# Patient Record
Sex: Female | Born: 1935 | Race: Black or African American | Hispanic: No | Marital: Married | Smoking: Never smoker
Health system: Southern US, Community
[De-identification: ages and names within clinical notes are randomized; demographics above are authoritative.]

## PROBLEM LIST (undated history)

## (undated) DIAGNOSIS — F0390 Unspecified dementia without behavioral disturbance: Secondary | ICD-10-CM

## (undated) DIAGNOSIS — N189 Chronic kidney disease, unspecified: Secondary | ICD-10-CM

## (undated) DIAGNOSIS — R269 Unspecified abnormalities of gait and mobility: Secondary | ICD-10-CM

## (undated) DIAGNOSIS — I10 Essential (primary) hypertension: Secondary | ICD-10-CM

## (undated) DIAGNOSIS — R443 Hallucinations, unspecified: Secondary | ICD-10-CM

## (undated) DIAGNOSIS — M4712 Other spondylosis with myelopathy, cervical region: Secondary | ICD-10-CM

## (undated) DIAGNOSIS — M47812 Spondylosis without myelopathy or radiculopathy, cervical region: Secondary | ICD-10-CM

## (undated) HISTORY — DX: Hallucinations, unspecified: R44.3

## (undated) HISTORY — PX: OTHER SURGICAL HISTORY: SHX169

## (undated) HISTORY — PX: KNEE ARTHROSCOPY: SUR90

## (undated) HISTORY — PX: LUMBAR LAMINECTOMY: SHX95

## (undated) HISTORY — DX: Unspecified dementia, unspecified severity, without behavioral disturbance, psychotic disturbance, mood disturbance, and anxiety: F03.90

## (undated) HISTORY — DX: Unspecified abnormalities of gait and mobility: R26.9

---

## 1998-02-27 ENCOUNTER — Ambulatory Visit (HOSPITAL_COMMUNITY): Admission: RE | Admit: 1998-02-27 | Discharge: 1998-02-27 | Payer: Self-pay | Admitting: Neurology

## 1998-05-12 ENCOUNTER — Other Ambulatory Visit: Admission: RE | Admit: 1998-05-12 | Discharge: 1998-05-12 | Payer: Self-pay | Admitting: Emergency Medicine

## 1999-05-23 ENCOUNTER — Emergency Department (HOSPITAL_COMMUNITY): Admission: EM | Admit: 1999-05-23 | Discharge: 1999-05-23 | Payer: Self-pay | Admitting: Emergency Medicine

## 1999-05-23 ENCOUNTER — Encounter: Payer: Self-pay | Admitting: Emergency Medicine

## 1999-09-03 ENCOUNTER — Ambulatory Visit (HOSPITAL_COMMUNITY): Admission: RE | Admit: 1999-09-03 | Discharge: 1999-09-03 | Payer: Self-pay | Admitting: Gastroenterology

## 1999-10-29 ENCOUNTER — Encounter: Payer: Self-pay | Admitting: Emergency Medicine

## 1999-10-29 ENCOUNTER — Encounter: Admission: RE | Admit: 1999-10-29 | Discharge: 1999-10-29 | Payer: Self-pay | Admitting: Emergency Medicine

## 2000-01-17 ENCOUNTER — Emergency Department (HOSPITAL_COMMUNITY): Admission: EM | Admit: 2000-01-17 | Discharge: 2000-01-17 | Payer: Self-pay | Admitting: Emergency Medicine

## 2000-01-17 ENCOUNTER — Encounter: Payer: Self-pay | Admitting: Emergency Medicine

## 2000-02-25 ENCOUNTER — Ambulatory Visit (HOSPITAL_COMMUNITY): Admission: RE | Admit: 2000-02-25 | Discharge: 2000-02-25 | Payer: Self-pay | Admitting: Emergency Medicine

## 2001-01-22 ENCOUNTER — Encounter: Payer: Self-pay | Admitting: Gastroenterology

## 2001-01-22 ENCOUNTER — Encounter: Admission: RE | Admit: 2001-01-22 | Discharge: 2001-01-22 | Payer: Self-pay | Admitting: Gastroenterology

## 2001-07-10 ENCOUNTER — Other Ambulatory Visit: Admission: RE | Admit: 2001-07-10 | Discharge: 2001-07-10 | Payer: Self-pay | Admitting: Obstetrics and Gynecology

## 2001-09-14 ENCOUNTER — Emergency Department (HOSPITAL_COMMUNITY): Admission: EM | Admit: 2001-09-14 | Discharge: 2001-09-14 | Payer: Self-pay | Admitting: Emergency Medicine

## 2001-09-14 ENCOUNTER — Encounter: Payer: Self-pay | Admitting: Emergency Medicine

## 2001-10-08 ENCOUNTER — Ambulatory Visit: Admission: RE | Admit: 2001-10-08 | Discharge: 2001-10-08 | Payer: Self-pay | Admitting: Specialist

## 2001-11-19 ENCOUNTER — Encounter: Admission: RE | Admit: 2001-11-19 | Discharge: 2001-11-19 | Payer: Self-pay | Admitting: Specialist

## 2001-11-19 ENCOUNTER — Encounter: Payer: Self-pay | Admitting: Specialist

## 2002-01-16 ENCOUNTER — Inpatient Hospital Stay (HOSPITAL_COMMUNITY): Admission: RE | Admit: 2002-01-16 | Discharge: 2002-01-19 | Payer: Self-pay | Admitting: Specialist

## 2002-01-16 ENCOUNTER — Encounter: Payer: Self-pay | Admitting: Specialist

## 2002-08-05 ENCOUNTER — Other Ambulatory Visit: Admission: RE | Admit: 2002-08-05 | Discharge: 2002-08-05 | Payer: Self-pay | Admitting: Obstetrics and Gynecology

## 2002-10-18 ENCOUNTER — Encounter: Payer: Self-pay | Admitting: Specialist

## 2002-10-22 ENCOUNTER — Encounter: Payer: Self-pay | Admitting: Specialist

## 2002-10-22 ENCOUNTER — Inpatient Hospital Stay (HOSPITAL_COMMUNITY): Admission: RE | Admit: 2002-10-22 | Discharge: 2002-10-26 | Payer: Self-pay | Admitting: Specialist

## 2002-10-31 ENCOUNTER — Emergency Department (HOSPITAL_COMMUNITY): Admission: EM | Admit: 2002-10-31 | Discharge: 2002-10-31 | Payer: Self-pay | Admitting: Emergency Medicine

## 2002-10-31 ENCOUNTER — Encounter: Payer: Self-pay | Admitting: Emergency Medicine

## 2003-08-12 ENCOUNTER — Encounter: Admission: RE | Admit: 2003-08-12 | Discharge: 2003-08-12 | Payer: Self-pay | Admitting: Specialist

## 2003-10-17 ENCOUNTER — Encounter: Admission: RE | Admit: 2003-10-17 | Discharge: 2003-10-17 | Payer: Self-pay | Admitting: Specialist

## 2003-10-22 ENCOUNTER — Other Ambulatory Visit: Admission: RE | Admit: 2003-10-22 | Discharge: 2003-10-22 | Payer: Self-pay | Admitting: Obstetrics and Gynecology

## 2003-12-12 ENCOUNTER — Encounter: Admission: RE | Admit: 2003-12-12 | Discharge: 2003-12-12 | Payer: Self-pay | Admitting: Orthopedic Surgery

## 2004-02-09 ENCOUNTER — Inpatient Hospital Stay (HOSPITAL_COMMUNITY): Admission: RE | Admit: 2004-02-09 | Discharge: 2004-02-13 | Payer: Self-pay | Admitting: Orthopedic Surgery

## 2004-07-01 ENCOUNTER — Encounter: Admission: RE | Admit: 2004-07-01 | Discharge: 2004-07-01 | Payer: Self-pay | Admitting: Sports Medicine

## 2004-07-12 ENCOUNTER — Encounter: Admission: RE | Admit: 2004-07-12 | Discharge: 2004-07-12 | Payer: Self-pay | Admitting: Sports Medicine

## 2004-07-26 ENCOUNTER — Encounter: Admission: RE | Admit: 2004-07-26 | Discharge: 2004-07-26 | Payer: Self-pay | Admitting: Sports Medicine

## 2004-11-18 ENCOUNTER — Other Ambulatory Visit: Admission: RE | Admit: 2004-11-18 | Discharge: 2004-11-18 | Payer: Self-pay | Admitting: Obstetrics and Gynecology

## 2005-08-13 ENCOUNTER — Encounter: Admission: RE | Admit: 2005-08-13 | Discharge: 2005-08-13 | Payer: Self-pay | Admitting: Emergency Medicine

## 2006-05-15 ENCOUNTER — Encounter: Admission: RE | Admit: 2006-05-15 | Discharge: 2006-05-15 | Payer: Self-pay | Admitting: Orthopedic Surgery

## 2006-06-01 ENCOUNTER — Encounter: Admission: RE | Admit: 2006-06-01 | Discharge: 2006-06-01 | Payer: Self-pay | Admitting: Orthopedic Surgery

## 2006-06-20 ENCOUNTER — Encounter: Admission: RE | Admit: 2006-06-20 | Discharge: 2006-06-20 | Payer: Self-pay | Admitting: Orthopedic Surgery

## 2007-04-26 ENCOUNTER — Encounter: Admission: RE | Admit: 2007-04-26 | Discharge: 2007-04-26 | Payer: Self-pay | Admitting: Sports Medicine

## 2007-05-04 ENCOUNTER — Encounter: Admission: RE | Admit: 2007-05-04 | Discharge: 2007-05-04 | Payer: Self-pay | Admitting: Sports Medicine

## 2007-05-15 ENCOUNTER — Encounter: Admission: RE | Admit: 2007-05-15 | Discharge: 2007-05-15 | Payer: Self-pay | Admitting: Sports Medicine

## 2007-09-11 ENCOUNTER — Encounter: Admission: RE | Admit: 2007-09-11 | Discharge: 2007-09-11 | Payer: Self-pay | Admitting: Chiropractic Medicine

## 2007-12-11 ENCOUNTER — Encounter: Admission: RE | Admit: 2007-12-11 | Discharge: 2007-12-11 | Payer: Self-pay | Admitting: Neurology

## 2007-12-27 ENCOUNTER — Encounter: Admission: RE | Admit: 2007-12-27 | Discharge: 2007-12-27 | Payer: Self-pay | Admitting: Sports Medicine

## 2008-04-15 ENCOUNTER — Encounter: Admission: RE | Admit: 2008-04-15 | Discharge: 2008-04-15 | Payer: Self-pay | Admitting: Sports Medicine

## 2008-07-10 ENCOUNTER — Encounter: Admission: RE | Admit: 2008-07-10 | Discharge: 2008-07-10 | Payer: Self-pay | Admitting: Sports Medicine

## 2009-03-10 ENCOUNTER — Encounter: Admission: RE | Admit: 2009-03-10 | Discharge: 2009-03-10 | Payer: Self-pay | Admitting: Sports Medicine

## 2009-07-06 ENCOUNTER — Encounter: Admission: RE | Admit: 2009-07-06 | Discharge: 2009-07-06 | Payer: Self-pay | Admitting: Sports Medicine

## 2009-08-24 ENCOUNTER — Encounter: Admission: RE | Admit: 2009-08-24 | Discharge: 2009-08-24 | Payer: Self-pay | Admitting: Sports Medicine

## 2009-11-05 ENCOUNTER — Encounter: Admission: RE | Admit: 2009-11-05 | Discharge: 2009-11-05 | Payer: Self-pay | Admitting: Sports Medicine

## 2010-02-17 ENCOUNTER — Encounter: Admission: RE | Admit: 2010-02-17 | Discharge: 2010-02-17 | Payer: Self-pay | Admitting: Sports Medicine

## 2010-05-10 ENCOUNTER — Inpatient Hospital Stay (HOSPITAL_COMMUNITY)
Admission: EM | Admit: 2010-05-10 | Discharge: 2010-05-13 | Payer: Self-pay | Source: Home / Self Care | Admitting: Emergency Medicine

## 2010-05-11 ENCOUNTER — Ambulatory Visit: Payer: Self-pay | Admitting: Internal Medicine

## 2010-06-23 ENCOUNTER — Encounter: Payer: Self-pay | Admitting: Internal Medicine

## 2010-06-30 ENCOUNTER — Encounter
Admission: RE | Admit: 2010-06-30 | Discharge: 2010-06-30 | Payer: Self-pay | Source: Home / Self Care | Attending: Sports Medicine | Admitting: Sports Medicine

## 2010-07-04 ENCOUNTER — Encounter: Payer: Self-pay | Admitting: Emergency Medicine

## 2010-07-04 ENCOUNTER — Encounter: Payer: Self-pay | Admitting: Specialist

## 2010-07-15 NOTE — Procedures (Signed)
Summary: ERCP  Patient: [name] Note: All result statuses are Final unless otherwise noted.  Tests: (1) ERCP (ERC)   ERC ERCP                  DONE     [facility]     [address]     [place], [place]  [address]           ERCP PROCEDURE REPORT           PATIENT:  [name], [name]  MR#:  [id number]     BIRTHDATE:  11-Jan-1935  GENDER:  female           ENDOSCOPIST:  Iva Boop, MD, [facility]           PROCEDURE DATE:  05/11/2010     PROCEDURE:  ERCP with sphincterotomy, ERCP with removal of stones           INDICATIONS:  stone CBD stone seen on [place]           MEDICATIONS:   Fentanyl 75 mcg IV, Versed 4 mg IV On Zosyn IV also     TOPICAL ANESTHETIC:  Cetacaine Spray           DESCRIPTION OF PROCEDURE:   After the risks benefits and     alternatives of the procedure were thoroughly explained, informed     consent was obtained.  The Pentax ERCP [id number] endoscope was     introduced through the mouth and advanced to the second portion of     the duodenum.           The endoscopic survey of the stomach and duodenum was normal with     limited views of the esophagus.  A 10 mm stone was found in the     common bile duct. Sphincterotomy was performed with a shortwire     papillotome using guidewire technique. A stone retrieval balloon     was passed and all apparent stones removed. Some air bubbles were     seen. A dilatation was found in the extrahepatic ducts. Up to 12     mm  Multiple stones were found in the gallbladder.  The     intrahepatic and extrahepatic bile ducts were otherwise normal.     Pancreatogram was not attempted.   The scope was then completely     withdrawn from the patient and the procedure terminated.     <<PROCEDUREIMAGES>>           COMPLICATIONS:  None           ENDOSCOPIC IMPRESSION:     1) Stone in the common bile duct - removed after sphincterotomy           2) Dilatation in the extrahepatic ducts up to 12 mm     3) Stones,  multiple in the gallbladder     4) Otherwise normal  biliary tree     5) Normal endoscopic survey of stomach and duodenum     RECOMMENDATIONS:     clear liquids now     [facility] consult (I called) re: gallstones     F/U labs tomorrow           Iva Boop, MD, [facility]           CC:  [facility]     Tally Joe, M.D.           n.  eSIGNED:   Iva Boop at 05/11/2010 03:38 PM           [name], [id number]  Note: An exclamation mark (!) indicates a result that was not dispersed into the flowsheet. Document Creation Date: 05/11/2010 4:09 PM _______________________________________________________________________  (1) Order result status: Final Collection or observation date-time: 05/11/2010 15:29 Requested date-time:  Receipt date-time:  Reported date-time:  Referring Physician:   Ordering Physician: Stan Head [phone]) Specimen Source:  Source: Launa Grill Order Number: [id number] Lab site:

## 2010-07-29 NOTE — Letter (Signed)
Summary: [facility]   Imported By: Lennie Odor 07/22/2010 15:50:00  _____________________________________________________________________  External Attachment:    Type:   Image     Comment:   External Document

## 2010-08-23 LAB — BASIC METABOLIC PANEL
BUN: 10 mg/dL (ref 6–23)
CO2: 28 mEq/L (ref 19–32)
Chloride: 105 mEq/L (ref 96–112)
Creatinine, Ser: 1.09 mg/dL (ref 0.4–1.2)
Glucose, Bld: 85 mg/dL (ref 70–99)

## 2010-08-23 LAB — PROTIME-INR: INR: 1.21 (ref 0.00–1.49)

## 2010-08-23 LAB — HEMOGLOBIN A1C
Hgb A1c MFr Bld: 5.5 % (ref <5.7)
Mean Plasma Glucose: 111 mg/dL (ref <117)

## 2010-08-23 LAB — HEPATIC FUNCTION PANEL
ALT: 157 U/L — ABNORMAL HIGH (ref 0–35)
Indirect Bilirubin: 0.7 mg/dL (ref 0.3–0.9)
Total Bilirubin: 1.7 mg/dL — ABNORMAL HIGH (ref 0.3–1.2)

## 2010-08-23 LAB — CBC
Hemoglobin: 12.6 g/dL (ref 12.0–15.0)
MCV: 92.6 fL (ref 78.0–100.0)
RBC: 4.17 MIL/uL (ref 3.87–5.11)
WBC: 3.7 10*3/uL — ABNORMAL LOW (ref 4.0–10.5)

## 2010-08-23 LAB — MAGNESIUM: Magnesium: 1.8 mg/dL (ref 1.5–2.5)

## 2010-08-23 LAB — LIPASE, BLOOD: Lipase: 32 U/L (ref 11–59)

## 2010-08-24 LAB — COMPREHENSIVE METABOLIC PANEL
ALT: 161 U/L — ABNORMAL HIGH (ref 0–35)
ALT: 64 U/L — ABNORMAL HIGH (ref 0–35)
AST: 215 U/L — ABNORMAL HIGH (ref 0–37)
Albumin: 3 g/dL — ABNORMAL LOW (ref 3.5–5.2)
Albumin: 3.6 g/dL (ref 3.5–5.2)
Alkaline Phosphatase: 70 U/L (ref 39–117)
CO2: 29 mEq/L (ref 19–32)
Calcium: 9 mg/dL (ref 8.4–10.5)
GFR calc Af Amer: 59 mL/min — ABNORMAL LOW (ref >60)
GFR calc non Af Amer: 49 mL/min — ABNORMAL LOW (ref >60)
Glucose, Bld: 106 mg/dL — ABNORMAL HIGH (ref 70–99)
Potassium: 3.1 mEq/L — ABNORMAL LOW (ref 3.5–5.1)
Sodium: 140 mEq/L (ref 135–145)
Sodium: 142 mEq/L (ref 135–145)
Total Protein: 7.4 g/dL (ref 6.0–8.3)

## 2010-08-24 LAB — DIFFERENTIAL
Basophils Relative: 0 % (ref 0–1)
Eosinophils Absolute: 0 10*3/uL (ref 0.0–0.7)
Monocytes Absolute: 0.5 10*3/uL (ref 0.1–1.0)
Monocytes Relative: 8 % (ref 3–12)

## 2010-08-24 LAB — CBC
HCT: 40.1 % (ref 36.0–46.0)
Hemoglobin: 12.9 g/dL (ref 12.0–15.0)
Hemoglobin: 13.3 g/dL (ref 12.0–15.0)
MCHC: 33.2 g/dL (ref 30.0–36.0)
MCHC: 33.5 g/dL (ref 30.0–36.0)
MCV: 92.6 fL (ref 78.0–100.0)
Platelets: 141 10*3/uL — ABNORMAL LOW (ref 150–400)
RBC: 4.17 MIL/uL (ref 3.87–5.11)

## 2010-08-24 LAB — LIPID PANEL
Cholesterol: 172 mg/dL (ref 0–200)
HDL: 54 mg/dL (ref >39)
Triglycerides: 32 mg/dL (ref <150)

## 2010-08-24 LAB — URINE CULTURE
Colony Count: NO GROWTH
Culture  Setup Time: 201111300608
Culture: NO GROWTH

## 2010-08-24 LAB — CK TOTAL AND CKMB (NOT AT ARMC): CK, MB: 1 ng/mL (ref 0.3–4.0)

## 2010-08-24 LAB — CARDIAC PANEL(CRET KIN+CKTOT+MB+TROPI)
Relative Index: 0.5 (ref 0.0–2.5)
Troponin I: 0.02 ng/mL (ref 0.00–0.06)

## 2010-08-24 LAB — CULTURE, BLOOD (ROUTINE X 2): Culture  Setup Time: 201111290833

## 2010-08-24 LAB — LIPASE, BLOOD: Lipase: 32 U/L (ref 11–59)

## 2010-08-24 LAB — AMYLASE: Amylase: 349 U/L — ABNORMAL HIGH (ref 0–105)

## 2010-08-30 ENCOUNTER — Other Ambulatory Visit: Payer: Self-pay | Admitting: Sports Medicine

## 2010-08-30 DIAGNOSIS — M545 Low back pain: Secondary | ICD-10-CM

## 2010-09-01 ENCOUNTER — Ambulatory Visit
Admission: RE | Admit: 2010-09-01 | Discharge: 2010-09-01 | Disposition: A | Discharge status: Home / Self Care | Payer: MEDICARE | Source: Ambulatory Visit | Attending: Sports Medicine | Admitting: Sports Medicine

## 2010-09-01 ENCOUNTER — Other Ambulatory Visit: Payer: Self-pay | Admitting: Sports Medicine

## 2010-09-01 DIAGNOSIS — M545 Low back pain: Secondary | ICD-10-CM

## 2010-10-29 NOTE — Discharge Summary (Signed)
NAME:  SIMON, AABERG NO.:  [id number]   MEDICAL RECORD NO.:  [id number]                   PATIENT TYPE:  INP   LOCATION:                                       FACILITY:  [facility]   PHYSICIAN:  Wende Neighbors, P.A.              DATE OF BIRTH:  04/05/1935   DATE OF ADMISSION:  10/22/2002  DATE OF DISCHARGE:  10/26/2002                                 DISCHARGE SUMMARY   ADMISSION DIAGNOSES:  1. End-stage osteoarthritis of right knee.  2. History of hypertension with no current treatment with medication.  3. History of kidney stones.  4. History of anxiety.  5. Status post lumbar decompression, L3-S1 and status post cervical     decompression.   DISCHARGE DIAGNOSES:  1. End-stage osteoarthritis of right knee.  2. History of hypertension with no current treatment with medication.  3. History of kidney stones.  4. History of anxiety.  5. Status post lumbar decompression, L3-S1 and status post cervical     decompression.  6. Posthemorrhagic anemia requiring blood transfusion.  7. Hypokalemia.   PROCEDURE:  On Oct 22, 2002, the patient underwent right cemented total knee  arthroplasty performed by Kerrin Champagne, M.D., assisted by Alverda Skeans, PA-  C. under general anesthesia.   CONSULTATIONS:  Ellwood Dense, M.D. of Physical Medicine and  Rehabilitation.   BRIEF HISTORY:  The patient is a 76 year old black female who has been  treated for several years with right knee pain secondary to osteoarthritis.  At this time, she has failed conservative treatment and is requiring  narcotic analgesics for her discomfort.  She is having difficulty with  activities of daily living, secondary to persistent right knee pain and a  flexion deformity and valgus deformity.  Radiographs demonstrate end-stage  patellofemoral lateral joint line changes of osteoarthritis which are quite  severe.  It was felt that she would require surgical intervention and was  admitted for the procedure as stated above.   BRIEF HOSPITAL COURSE:  The patient tolerated the procedure under general  anesthesia without complications.  Postoperatively, neurovascular motor  function of the lower extremities was noted to be intact.  The patient was  placed in the CPM machine on the first postoperative day and tolerated this  well.  She started a physical therapy routine for ambulation and gait  training and was allowed 50% partial weightbearing on the operative  extremity.  She was able to maintain this weightbearing status and  progressed with physical therapy for ambulation.  Prior to discharge, she  had ambulated as much as 110 feet with close supervision during the hospital  stay.  Initially, it was felt she would be slow to progress and she did not  have assistance at home.  Therefore, rehabilitation consult was obtained.  She was felt to be a suitable candidate if she did not progress with her  activity.  However, she was able to do quite well and arrangements were made  for her to be discharged home.  During the hospital stay, she was treated  for postop anemia and required 2 units of blood on her second postoperative  day.  She tolerated the transfusion well and her hemoglobin returned to a  value of 8.9.  The patient was placed on Coumadin for DVT and PE  prophylaxis.  Adjustments in her Coumadin  dose were made according to daily  pro times by the pharmacist at [facility].  The patient had  hypokalemia which was treated with oral supplements and was noted to be  stable.  The patient's Hemovac drain was discontinued on the first  postoperative day and daily dressing changes were done thereafter.  The  patient had no signs of infection including no drainage, erythema.  She did  have some mild edema which was treated with ice packs.  The patient received  occupational therapy for ADL's and tolerated this well.  On Oct 26, 2002,  she was stable for  discharge to her home.   PERTINENT LABORATORY VALUES:  Chest x-ray on admission showed stable  borderline cardiomegaly with no active disease.  CBC on admission with  hemoglobin 11.3, hematocrit 34.5.  Lowest value was noted to be 8.1 and 24.4  for hemoglobin and hematocrit and after two units of autologous blood, the  values were 8.9 and 27.0.  Coagulation studies were monitored throughout the  hospital stay and at discharge, noted to be:  PT 23.6 and INR 2.5.  BMET on  admission was within normal limits.  She did have hypokalemia at 3.2 prior  to discharge.  Urinalysis on admission was negative for urinary tract  infection.   PLAN:  The patient was discharged to her home.  Arrangements were made for  her to be seen by [facility] for physical therapy.  She wanted  a total knee replacement protocol and will be partial weightbearing of 50%.  She will receive range of motion and strengthening exercises.  Dressing  change will be done daily at home and she will be allowed to shower on  postop day #5 if there is no wound drainage.  She will followup in two weeks  with Kerrin Champagne, M.D. and has been advised to call to make the  appointment.  She has been advised to call the office if there are questions  or concerns prior to her return office visit.   DISCHARGE MEDICATIONS:  The patient received prescriptions for OxyContin,  Percocet, Robaxin and Coumadin.  Coumadin management will be provided by  [facility].   DIET:  She will resume her regular diet.                                                   Wende Neighbors, P.A.    SMV/MEDQ  D:  01/30/2003  T:  01/31/2003  Job:  [id number]

## 2010-10-29 NOTE — Consult Note (Signed)
NAME:  NISHTHA, RAIDER NO.:  [id number]   MEDICAL RECORD NO.:  [id number]                   PATIENT TYPE:  INP   LOCATION:  5004                                 FACILITY:  [facility]   PHYSICIAN:  Lesleigh Noe, M.D.            DATE OF BIRTH:  1935-04-11   DATE OF CONSULTATION:  02/09/2004  DATE OF DISCHARGE:                                   CONSULTATION   REASON FOR CONSULTATION:  Tachycardia.   CONCLUSIONS:  1. Junctional tachycardia at 120 beats per minute with retrograde P waves,     acute onset.  The patient is hemodynamically stable.  2. Hypertension.  3. Total knee replacement, right, February 09, 2004.   RECOMMENDATIONS:  1. Telemetry.  2. IV Lopressor 5 mg to be followed in 15 minutes by an additional 5 mg if     tachycardia persists.  3. Serial cardiac enzymes.  4. EKG in a.m.   COMMENTS:  Ms. Siciliano is a 76 year old who has a history of  tachypalpitations.  This was documented to be supraventricular tachycardia  in 2004.  Echocardiogram at that time demonstrated normal left ventricular  size and function with normal valvular function, EF was felt to be normal.  Left atrial size was also normal at 3.9.  The patient underwent a right  total knee revision today and in the postop recovery developed tachycardia  at a rate of 115 to 120 that was regular and on monitor as well as EKG and  retrograde P waves were noted.  She is having no specific cardiac complaints  although for the past several weeks has had occasional discomfort in the  left arm.  She is not currently short of breath.   Her current medical regimen is Toprol-XL 50 mg per day which in the past has  suppressed this arrhythmia.   PHYSICAL EXAMINATION:  GENERAL:  The patient was lying flat.  She is in no  acute distress.  VITAL SIGNS:  The blood pressure is 124/70, heart rate is 117 and regular.  NECK:  Reveals no JVD, carotid massage did not bring about resolution of the  arrhythmia.  LUNGS:  Clear.  CARDIAC:  A regular tachycardia.  ABDOMEN:  Soft.  EXTREMITIES:  No edema.   EKG reveals junctional tachycardia at 117 beats per minute.  Retrograde P  waves are noted but no acute ischemic changes are noted.  Laboratory data is  pending.   DISCUSSION:  I believe this is the recurrence of the tachycardia the patient  has had off and on over the years.  We will give her some IV beta-blocker on  top of her standard therapy to see if this will break the tachycardia.  If  it will not, she will need to be transferred to a telemetry bed and  additional medical treatment options considered.  Lesleigh Noe, M.D.    HWS/MEDQ  D:  02/09/2004  T:  02/09/2004  Job:  [id number]   cc:   Reuben Likes, M.D.  [address]  [place]  [place] [address]  Fax: [phone]   Loreta Ave, M.D.  [address]  [place] [address]  Fax: [phone]

## 2010-10-29 NOTE — H&P (Signed)
[place]  Patient:    [name], [name] Visit Number: [id number] MRN: [id number]          Service Type: SUR Location: 1E 0105 01 Attending Physician:  Lubertha South Dictated by:   Druscilla Brownie Shela Nevin, P.A. Admit Date:  10/08/2001   CC:         Reuben Likes, M.D.  Peter M. Swaziland, M.D.   History and Physical  DATE OF BIRTH:  09/11/1934. DATE OF HISTORY AND PHYSICAL:  January 03, 2002.  CHIEF COMPLAINT:  "Pain in my back and legs."  HISTORY OF PRESENT ILLNESS:  This 76 year old female has had continuing problems concerning her low back with radiation in the lower extremities.  She has had progressive problems with degenerative disk disease and spinal stenosis, which have been well-documented by studies.  She has weakness developing in the dorsiflexion of the left foot.  We have tried her on analgesics as well as anti-inflammatory and, unfortunately, this has not helped.  Due to the stenosis at L2-3 with moderately severe L3-4 and L4-5 and foraminal narrowing seen at L5-S1, it was felt this patient would benefit from surgical intervention and is being admitted for central laminectomy at L3-4, L4-5, and L5-S1.  She has or will have donated two units of autologous blood for this procedure.  She has been seen by her physicians and, Reuben Likes, M.D., internal medicine, and Peter M. Swaziland, M.D., her cardiologist, are aware of the surgery and have cleared her for the surgery.  PAST MEDICAL HISTORY:  The patients surgeries include lithotripsy as well as scope removal of renal calculi.  She has had an anterior cervical diskectomy by Hewitt Shorts, M.D., in [phone].  Hysterectomy in 1978.  MEDICATIONS:  Celebrex, Premarin, and a Bayer aspirin a day.  ALLERGIES:  No known allergies.  SOCIAL HISTORY:  The patient neither smokes nor drinks.  FAMILY HISTORY:  Positive for colon cancer in the father.  REVIEW OF SYSTEMS:   CENTRAL NERVOUS SYSTEM:  No seizure disorder, paralysis, numbness, or double vision.  The patient does have radiculitis consistent with spinal stenosis.  CARDIOVASCULAR:  No chest pain, no angina, no orthopnea. GASTROINTESTINAL:  No nausea, vomiting, melena, or bloody stools.  She does have hemorrhoids, which do not bother her.  RESPIRATORY:  No productive cough, no hemoptysis, no shortness of breath.  GENITOURINARY:  No discharge, dysuria, or hematuria.  MUSCULOSKELETAL:  Primarily in present illness.  PHYSICAL EXAMINATION:  VITAL SIGNS:  Blood pressure 160/88, pulse 84 and regular, respirations are 12.  GENERAL:  An alert, cooperative, friendly 76 year old female.  HEENT:  Normocephalic.  PERRLA.  EOMs intact.  Oropharynx is clear.  CHEST:  Clear to auscultation.  No rhonchi, no rales.  CARDIAC:  Regular rate and rhythm.  No murmurs are heard.  ABDOMEN:  Soft, nontender.  Liver and spleen not felt.  GENITOURINARY, RECTAL, PELVIC, BREASTS:  Not done, not pertinent to present illness.  EXTREMITIES:  As in present illness above.  ADMITTING DIAGNOSES: 1. Lumbar spinal stenosis L3-4, L4-5, L5-S1. 2. History of anxiety attacks (remote).  PLAN:  The patient will undergo central lumbar laminectomy at L3-4, L4-5, L5-S1.  She has donated two units of autologous blood.  Should she have any medical problems in the hospital, we will certainly call Dr. Leslee Home, et al., and should she have any cardiology problems, we will call Dr. [place]iland. Dictated by:   Druscilla Brownie. Shela Nevin, P.A. Attending Physician:  Lubertha South DD:  01/03/02 TD:  01/07/02 Job: [id number]

## 2010-10-29 NOTE — Op Note (Signed)
NAME:  ADRIEANNA, [name]                            ACCOUNT NO.:  [id number]   MEDICAL RECORD NO.:  [id number]                   PATIENT TYPE:  INP   LOCATION:  5012                                 FACILITY:  [facility]   PHYSICIAN:  Kerrin Champagne, M.D.                DATE OF BIRTH:  10-22-1934   DATE OF PROCEDURE:  10/22/2002  DATE OF DISCHARGE:                                 OPERATIVE REPORT   PREOPERATIVE DIAGNOSIS:  Right knee severe osteoarthritis with valgus and  flexion deformity.   POSTOPERATIVE DIAGNOSIS:  Right knee severe osteoarthritis with valgus and  flexion deformity.   PROCEDURE:  Right cemented total knee arthroplasty using [employer] Scorpio  flexed posterior stabilized knee implants, #7 cemented femoral component,  and #7 cemented tibial component with a 10 mm Flex posterior stabilized  tibial tray.  26 mm of patella component.   SURGEON:  Kerrin Champagne, M.D.   ASSISTANT:  Wende Neighbors, P.A.   ANESTHESIA:  GOT by Belva Agee, M.D. supplemented with local femoral  nerve block.   ESTIMATED BLOOD LOSS:  150 mL.  Drains; Foley to straight drain and Hemovac  x1.   TOURNIQUET TIME:  1 hour 57 minutes at 305 mmHg.   INDICATIONS FOR PROCEDURE:  The patient is a 76 year old female who has been  treated for years for problems of right knee osteoarthritis.  This has  gotten progressively worse to the point where she is requiring narcotic  medications to sleep at night.  She has undergone conservative attempts at  treatment including anti-inflammatory agents, steroid injections, Synvisc  injections and all without relief of pain.  Only temporizing her discomfort.  She has had recent back surgery for problems of spinal stenosis, did well  following this surgery.  Persistent right knee pain with flexion deformity  and valgus deformity.  Plain radiographs demonstrating both patellofemoral  and lateral joint line changes of osteoarthritis that was severe.  She is  brought to the operating room to undergo right total knee arthroplasty.   DESCRIPTION OF PROCEDURE:  After adequate general anesthesia, the right  lower extremity was prepped from the toes to the upper thigh.  A tourniquet  about the upper thigh.  Draped in the usual manner with a bump under the  right buttock.  An iodine Idrape was used.  Standard preoperative  antibiotics.  The incision approximately 15 cm in length standard in the  midline of the quadriceps tendon over the anteromedial aspect of the patella  and in line with the medial aspect of the anterior tibial tubercle.  Additionally through the skin and subcu layers using a 10 blade scalpel  directly down to the external retinaculum of the knee and this was then  incised in line, developed both medial and lateral.  The deep retinacular  layers of the knee incised along the medial parapatellar region along the  medial border of the patella tendon and extended into the midportion of the  quadriceps proximally. The patella then everted and a fat pad debrided over  the posterior aspect of the patella tendon over about 50%. The medial knee  retinaculum along with the periosteum was incised over the medial aspect of  the anterior tibial tubercle and then elevated exposing the anteromedial  aspect of the proximal tibia.  The medial meniscus then excised in total  using electrocautery.  Patella everted and the lateral patella ligament  incised and divided.  The knee was then flexed.  The lateral meniscus  debrided using a 10 blade scalpel with electrocautery.  A drill hole was  then placed into the distal femur, the guide wires in place for the distal  femoral cutting jig.  This was done after debridement of the ACL and PCL in  the midline. The distal cutting jig was then placed and pinned into place  and approximately 10 degrees of external rotation lined up the epicondylar  axis.  This was pinned to the distal femur.  Additional 2 mm of  cut chose to  be taken from the distal femur because of flexion deformity present.  The  cut was then performed transversely without difficulty protecting the soft  tissue sleeve.  The distal femoral sizing guide then placed and the fat pad  over the upper aspect of the supracondylar region was elevated off of the  bone anteriorly.  The gauge was then used to ascertain the correct size of  the femoral component, chose as a cement #7.  This then chosen, pins were  then placed into the distal femur for alignment of the cutting guide for the  distal femoral cutting for chamfers and coronal cuts.  After placing these  pins and removing the distal sizing guide, the cutting block for the chamfer  cuts and coronal cuts both anteriorly and posteriorly was then placed over  the end of the femur and then held in place with clamps.  First the anterior  cut was performed protecting the soft tissue and removing it appropriately,  then protecting the posterior aspect of the posterior femoral condyles and  the capsular areas with __________ .  The coronal cuts were then made over  the posterior aspect of the femoral condyles.  Chamfer cuts were then  performed posteriorly and then anterior and all bone excess then removed.  Cutting block then removed.   The remaining portions of the menisci posteriorly were then excised  bilaterally along with partial capsulotomy performed here.  The remaining  remnants of the posterior cruciate ligament excised as well, freeing up  posterior elements.  The popliteus tendon was able to be preserved.  The  knee then subluxed, tripped the tibia anteriorly.  McGill retractors  inserted.  The tibial spines removed using oscillating saw and a #7 tray  then used to place the drill guide and place a drill hole into the proximal  tibial channel.  The canal finder was then placed and then the alignment guide placed along with a cutting jig for the proximal tibia set at a 5   degree posterior slope.  This was then pinned in the corrected degree of  internal rotation using alignment guide, bisecting the distal ankle and  malleolar axis.  This was pinned in appropriate position.  4mm cut off of  the lateral aspect of the joint line providing the most superficial of cuts.  The soft tissue was then protected and  the oscillating saw then used to  perform the cut after pinning the tibial guide and removing the  intermedullary guide portion of the cutting jig.  Following this cut, it was  judged that the cut was too short so an additional 2 mm were taken off after  placement of trial components and after performing cutting of the distal  femoral notch.  Both the intercondylar notch as well as the anterior  intercondylar groove cut using the appropriate cutting jig.  Trial reduction  was performed using the femoral components.  Trial femoral component #7 and  the tibial tray with 10 mm insert.  The knee could not be fully extended, so  an additional 2 mm was cut off the proximal tibia using the previously  placed pins that were left in during this time. A total of 8 mm then was cut  off the proximal tibia.  With this then, trial was carried with full  extension of the patient's right knee and flexion to over 125 degrees  without lift off of the tibial try. No tendency for subluxation of the  tibial tray noted.  Irrigation was then performed.  The 26 mm reamer used to  ream the patella over the medial aspect of the retro portion of the patella  to a depth of 10 mm.  All osteophytes were resected and drill holes were  used to additionally place within the patella posterior aspect of the  patella for fixation purposes.  Once this was completed, then irrigation was  performed.  The knee then hyperflexed, McGill retractors then reinserted  after marking the correct alignment for the tibial tray component again  using the tibial alignment guide pin and placing marks over the  anterior  proximal tibia both medial and lateral.  After placing the McGill's and  removing the femoral trial component, then a #7 tray was then chosen.  This  was then fixed in the correct degree of internal rotation based on the marks  placed over the anterior tibial proximal.  Two pins were used to fix it and  then osteotome was then used to cut the trough of the flange for the tibial  component.  This was done up to a #7 cement flange.  Once this was completed  then all cuts had been performed.  Irrigation was performed.  Careful  cauterization of soft tissue in the expected areas of arteries of the  posterior medial and posterior lateral aspect of the joint and also in the  region of expected perforators would be over the posterior knee region.  After further irrigation and bone removed from the intercondylar region was  then placed into the hole for the alignment guide and impacted into place. Cement was then mixed and permanent prostheses brought into the field.  Tibial component was first placed after pulsatile lavage and irrigation of  the knee was performed.  The knee flexed.  The McGill was used to retract  the soft tissue for exposure.  The soft tissue retraction. The tibial  component was then placed.  More cement was added to the medial aspect of  the tibial tray cement region.  Excess cement was removed.  The femoral  component then cemented into place with cement over the posterior trays over  the anterior aspects of the distal femur and over the distal cut surface of  the femur.  Impacted into place.  Additional bone cement placed into the  distal femoral hole where the alignment guide had  been placed previously.  This still remained slightly opened.  With this, the patella component was  then placed as well using additional cement here.  Each of the areas for the  holes had been marked previously and scored using electrocautery.  The  implant then placed and the  compression clamp used to compress the patella  to bone in place and excess cement removed.  Excess cement was removed from  all of the periprosthetic areas over the femur anteriorly and tibia  anteriorly with the knee then brought into full extension and left soft  until the cement had completely hardened.  When this was completed, again  irrigation was performed.  The tourniquet was released and the knee  inspected for any obvious bleeding.  Small bleeders within the subcu areas  were noted.  Over the deep areas there was no significant bleeding regions  where expected __________ were present.  With flexion and extension of the  knee, it was noted that the patella tended to tilt laterally and show some  catching within the notch in the distal portion of the femur so that a  lateral retinacular release was performed and osteophytes over the medial  and lateral aspects of the patella were excised as well as over the superior  pole of the patella.  This seemed to correct the problem quite nicely.  So  that with flexion and extension, the patella did not show any further  catching within the distal femoral notch.  Irrigation again performed.  Medium Hemovac drain placed in the incision exiting over the anterior  lateral aspect of the thigh.  The synovial lining then closed with a running  stitch of 0 Vicryl suture.  The retinaculum then closed with interrupted #1  Vicryl suture and the external retinaculum closed with interrupted 0 Vicryl.  Deep subcu layers approximated with interrupted 0 and 2-0 Vicryl sutures.  The skin closed with a running 4-0 Monocryl suture.  Tincture Benzoin and  Steri-Strips applied.  4x4's and ABD pads affixed to the skin with Kerlix  and Ace wrap applied.  The patient then reactivated and extubated.                                               Kerrin Champagne, M.D.    JEN/MEDQ  D:  10/22/2002  T:  10/23/2002  Job:  [id number]

## 2010-10-29 NOTE — Discharge Summary (Signed)
NAME:  [name], SICARD NO.:  [id number]   MEDICAL RECORD NO.:  [id number]                   PATIENT TYPE:  INP   LOCATION:  4741                                 FACILITY:  [facility]   PHYSICIAN:  Duke Salvia, M.D.               DATE OF BIRTH:  December 21, 1934   DATE OF ADMISSION:  02/09/2004  DATE OF DISCHARGE:                                 DISCHARGE SUMMARY   PHYSICIANS:  Cardiologist: Lyn Records, M.D.  Seen by Duke Salvia, M.D.  Primary care giver:  Reuben Likes, M.D.   ALLERGIES:  no known drug allergies.   HISTORY OF PRESENT ILLNESS:  Ms. [name] is a 76 year old female.  She is  currently at [facility] for revision of a right total knee  arthroplasty.  This was done on August 29.  She was found to have  supraventricular tachyarrhythmias on telemetry here, but the patient has  complained of rapid racing heart rate for the last two years.  At first,  this occurred only once every six months and last about 30 minutes.  She  would get left arm numbness, feeling of onset of asthma that she had as a  child, and a feeling in the chest as if it were to burst loose.  In the last  six months, these episodes have become more frequent, and they last longer.  Sometimes they last overnight.  Now they come on with exercise. They come on  with stress.  When she is in the rapid rate, she feels short of breath, weak  like she was going to pass out (but she has never had syncope).  Does not  necessarily feel her heart racing, but she feels stupid.  She can have  this rhythm occur when she bends over.  She has had prescription for beta  blocker in the past with instructions not to drink any caffeinated  beverages.  She does like Mellow Yellow and [facility], but she has been  staying strictly away from these.  Currently is feeling upset with any  exercise or stress that she is liable to slip into this rapid rate.   Thromboembolic risk factors for  coronary artery disease, hypertension.  The  patient gives no prior history of myocardial infarction, diabetes, or GI  bleed.  No prior history of pulmonary embolism or deep vein thrombosis.  No  history of CVA or seizures.  She has a history of stress test in the past  but is unsure of the result, and they are not on the chart.  She had  echocardiogram July 2004 showing mild tricuspid regurgitation and normal  left ventricular function.   MEDICATIONS HERE AT :  1.  Colace 100 mg twice daily.  2.  Trinsicon 1 capsule twice daily.  3.  Coumadin per pharmacy with concurrent IV heparin until Coumadin  therapeutic.  4.  Toprol XL 50 mg daily in the morning and Toprol XL 25 mg at bedtime.  5.  Premarin 0.3 mg daily.  6.  Robaxin 500 mg q.6h.  7.  She has had one supplementation of potassium.   SOCIAL HISTORY:  The patient lives in [place] with her husband.  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  She has never smoked, no  alcoholic beverages, no recreational drugs.   FAMILY HISTORY:  Not very contributory.  Her mother, she says, had  hypertension, but she is not sure of the cause of death.  Her father died at  age 66 of colon cancer. She had one brother who deceased; she is not sure of  his medical condition.  Her brother, who is living, has no coronary artery  disease.  She has seven sisters, all of whom are living but none of whom  have diabetes or coronary artery disease.   REVIEW OF SYSTEMS:  The patient is constitutionally not feeling any fevers,  chills, sweats.  No weight change, either loss or gain, and no adenopathy.  HEENT:  No epistaxis, no hoarseness, no vertigo, no photophobia.  INTEGUMENT:  No rashes or lesions or ulceration.  CARDIOPULMONARY:  The  patient is not complaining of chest pain, dyspnea on exertion, orthopnea,  paroxysmal nocturnal dyspnea.  She does have some  shortness of breath when  she is in the throes of this rapid heart rate.  She feels presyncopal.  MUSCULOSKELETAL:  She has arthralgias, particularly affecting both knees.  She is currently status post revision of a right total knee.  UROGENITAL:  She does get up once a night to urinate.  NEUROPSYCHIATRIC:  The patient  complains of weakness with these arrhythmias.  GI:  The patient has not ever  had any melena, bright red blood per rectum, abdominal pain, or GERD  symptoms.  No chronic nausea, vomiting.  No dysphagia or odynophagia.  ENDOCRINE:  The patient is not sure of her thyroid status, but she is not  having polyuria or heat or cold intolerance.  Other systems are negative.   PHYSICAL EXAMINATION:  VITAL SIGNS:  Temperature 97.5, pulse 54 and regular.  Respirations 18, blood pressure 93/57.  Oxygen saturation 94% on room air.  GENERAL:  At time of examination, the patient is alert and oriented x 3 and  comfortable, in no acute distress.  Telemetry shows sinus rhythm.  HEENT:  Eyes:  Pupils equal, round, and reactive to light.  Extraocular  movements intact.  Sclerae are clear.  Nares without discharge.  The  oropharyngeal mucous membranes are pink and moist without lesion or  erythema.  NECK:  Supple.  No carotid bruits auscultated.  No jugular venous  distention.  CARDIOVASCULAR:  Heart has regular rate and rhythm.  S1 and S2 clearly  auscultated.  LUNGS:  Clear to auscultation and percussion bilaterally.  ABDOMEN:  Nondistended, soft.  Bowel sounds in all quadrants.  No rebound or  guarding.  No hepatosplenomegaly.  No midline pulsations.  EXTREMITIES:  Radial pulses are 4/4 bilaterally.  Dorsalis pedis pulses 3/4  bilaterally.  No rashes or ulcerations.  MUSCULOSKELETAL:  Right knee currently is bandaged status post revision of  right total knee arthroplasty.  NEUROLOGIC:  No focal neurologic deficits noted.  LABORATORY AND X-RAY DATA:  Complete blood count:  White cells  6.6,  hemoglobin 10.7, hematocrit 31.5,  platelets 146.  Serum electrolytes:  Sodium 136, potassium 4.2, chloride 112, carbonate 17, BUN 9, creatinine  0.9, glucose 121.  Troponin I studies x 2 were 0.02 then 0.04.  BNP was 208.  PT as of today is 14.5, INR 1.2.   PROBLEM LIST:  1.  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  2.  Presyncope, shortness of breath, chest pain, and left arm numbness      secondary to tachyarrhythmia.  3.  Negative Cardiolite for this patient, unknown date.  4.  Status post revision of right total knee arthroplasty.  5.  Hypertension.  6.  Nightmares on TOPROL.   RECOMMENDATIONS:  1.  Dr. Graciela Husbands has seen the patient and examined the patient and researched      the patient's history, recommending a possible different beta blocker      than TOPROL which continues to give nightmares.  2.  Agree with adjunctive calcium channel blocker.  3.  Radio frequency catheter ablation if symptoms persist and patient is      desirous of ablation rather than continued medication after a few weeks      of anticoagulation when the risk of DVT has abated following right knee      surgery.  4.  The patient will be set up for followup appointment with Dr. Sherryl Manges in the office in three to four weeks.   Electrocardiogram:  One from August 29 showing a tachycardia with  ventricular rate of 117, PR interval 308, QRS duration 100, QTC 452.  The  study shows retrograde P waves most clearly in lead in II and P waves  following the QRS in aVL, also in lead V1 and possible retrograde P waves in  aVF which follow the QRS.  This is a short RP supraventricular tachycardia.      Maple Mirza, P.A.                    Duke Salvia, M.D.    GM/MEDQ  D:  02/11/2004  T:  02/12/2004  Job:  [id number]   cc:   Duke Salvia, M.D.   Reuben Likes, M.D.  [address]  [place]  [place] [address]  Fax: [phone]   Lyn Records III, M.D.  [address]  [place]  [place] [address]  Fax: [phone]

## 2010-10-29 NOTE — Op Note (Signed)
NAME:  [name], [name] NO.:  [id number]   MEDICAL RECORD NO.:  [id number]                   PATIENT TYPE:  INP   LOCATION:  2550                                 FACILITY:  [facility]   PHYSICIAN:  Loreta Ave, M.D.              DATE OF BIRTH:  January 10, 1935   DATE OF PROCEDURE:  02/09/2004  DATE OF DISCHARGE:                                 OPERATIVE REPORT   PREOPERATIVE DIAGNOSIS:  Status post right total knee replacement with  aseptic loosening tibial component. Marked adhesions intra-articular and  extra-articular with significant loss of motion.   POSTOPERATIVE DIAGNOSES:  1. Status post right total knee replacement with aseptic loosening tibial     component. Marked adhesions intra-articular and extra-articular with     significant loss of motion.   1. No evidence of infection.   PROCEDURE:  Right knee examination under anesthesia.  Arthrotomy.  Extensive  debridement of intra-articular and extra-articular adhesions to reestablish  motion.  Removal of loose tibial component metallic and polyethylene.  Revision of total knee with a new cemented stemmed #5 tibial component  Osteonics type with a 15 mm posterior stabilized flex insert.  Tibial  component size #5 with a 155 mm x 13 mm noncemented distal stem.  Soft  tissue balancing.   SURGEON:  Loreta Ave, M.D.   ASSISTANT:  Arlys John D. Petrarca, P.A.-C.   ANESTHESIA:  General anesthesia.   ESTIMATED BLOOD LOSS:  Minimal.   TOURNIQUET TIME:  One hour and 30 minutes.   SPECIMENS:  Excised soft tissue cultures, aerobic and anaerobic culture  obtained.  Of note, a stat Gram stain and cell count revealed no organisms  and only scant amount of white cells and red cells, nothing to suggest  infection.   DRAIN:  Hemovac x2.   DESCRIPTION OF PROCEDURE:  The patient brought to the operating room and  after adequate anesthesia had been obtained, right knee examined, 10 degree  flexion  contracture further flexion barely 90 degrees.  Marked adhesive  capsulitis and contracture.  Alignment slight varus.  Stable ligaments.  No  warmth or erythema.  Tour applied.  Prepped and draped in usual sterile  fashion.  Exsanguinated with elevation esmarch.  Tourniquet inflated to 350  mmHg.  Straight incision through the previous incision above the patella  down to the tibial tubercle.  The soft tissue contracture extra-articular  freed up.  Medial arthrotomy.  Extensive intra-articular adhesions all  debrided.  A scant amount of clear fluid was sent for a culture as well as  cell count.  Results as described above.  No evidence of infection.  Overgrowth of soft tissue over the patella all debrided and this was well  fixed.  Femoral component was exposed with the soft tissue removed and this  was also well fixed and in good condition. Tibial component was obviously  loose and could be  lifted up between the cement and metallic interface.  After freeing up soft tissue, polyethylene was removed from the tibia.  This  was still in reasonably good condition.  Tibial component was then accessed  with appropriate retractions to protect all structures and brought out of  the tibia.  A new freshening cut made on the tibia with appropriate guides  with a 0 degree cut resecting out about 5 mm so I could get a nice parallel  surface below the cement which had all been removed and good cancellous  bone. Care taken to remove cement from all recesses.  Sequential hand  reaming distally up to 13 mm.  The shaft was well centered over the tibial  component so I do not have to do an offset.  Appropriate sizing then  undertaken with a #5 component which gave me good cortical coverage  proximally and with the 155 x 13 mm stem I had excellent fixation distally  perpendicular to the shaft.  Rotation was marked with appropriate trials and  the thin portion of the component was then hand reamed.  All this  removed.  Copious irrigation with pulsed irrigating device throughout.  All recess  examined.  All loose fragments removed.  All hypertrophic synovial tissue  and scar tissue removed.  Cement was then prepared, placed on the tibial  component which was seated. The very distal end of the stem was press fit  cementing all around the metaphyseal region.  Polyethylene was attached.  The knee was reduced.  All excessive cement had been removed.  Once the  cement hardened, the knee was examined.  Full extension, full flexion, good  patellofemoral tracking, excellent stability in flexion and extension.  Wound irrigated.  Hemovacs placed.  Arthrotomy closed with #1 Vicryl, skin  and subcutaneous tissue with Vicryl and staple.  Margins of the wound in the  knee injected with Marcaine.  Hemovacs clamped.  Sterile compressive  dressing applied.  Tourniquet deflated and removed. Knee immobilizer  applied.  The anesthesia reversed.  The patient brought to the recovery  room.  The patient tolerated the procedure well with no complications.                                               Loreta Ave, M.D.    DFM/MEDQ  D:  02/09/2004  T:  02/09/2004  Job:  [id number]

## 2010-10-29 NOTE — Op Note (Signed)
NAME:  [name], [name]                            ACCOUNT NO.:  [id number]   MEDICAL RECORD NO.:  [id number]                   PATIENT TYPE:  INP   LOCATION:  5029                                 FACILITY:  [facility]   PHYSICIAN:  Kerrin Champagne, M.D.                DATE OF BIRTH:  1935/05/09   DATE OF PROCEDURE:  01/16/2002  DATE OF DISCHARGE:  01/19/2002                                 OPERATIVE REPORT   PREOPERATIVE DIAGNOSIS:  Lumbar spinal stenosis L3-4, L4-5 and L5-S1.   POSTOPERATIVE DIAGNOSIS:  Lumbar spinal stenosis L3-4, L4-5 and L5-S1.   PROCEDURE:  Central laminectomy at L3, L4 and L5 three levels with  decompression of bilateral L3, bilateral L2, bilateral L4, bilateral L5 and  bilateral S1 nerve roots.   SURGEON:  Dr. Vira Browns.   ASSISTANT:  Verlin Fester, P.A.   ANESTHESIA:  GOT, Dr. Zoila Shutter.   ESTIMATED BLOOD LOSS:  250 cc.   DRAINS:  Hemovac x1, Foley to straight drain.   BRIEF CLINICAL HISTORY:  This patient is a 76 year old female who presents  with a history of back pain radiation into her legs and neurogenic  claudication. She also has a history of osteoarthritis changes involving her  hips. I previously felt that hip replacement surgery may be necessary;  however, she has developed progressive weakness into her left lower  extremity with near foot drop. Pain in the lateral thigh L5 distribution.  She has been found to have retrolisthesis of L2 on L3, anterior listhesis of  L5 and S1 and minimal listhesis L4-5. She had been found to have severe  foraminal entrapment bilaterally affecting the L5 nerve roots at the L5-S1  level as well as lateral recess stenosis at the segment affecting the S1  nerve roots. Moderate central stenosis of both L3-4 and L4-5. She is  scheduled to undergo surgery in the form of central laminectomy L3 to sacrum  with bilateral lateral recess decompression and foraminal decompression. The  patient's intraoperative findings  demonstrated severe lateral recess  stenosis at the L5 level affecting the L5-S1 facets and causing S1 nerve  root compression. There was also L5 nerve root compression as it exited out  the neuroforamen at this segment due to hypertrophic changes involving the  facets and the underlying hypertrophic ligamentum flavum. There was found to  be moderately severe stenosis above L4-5 and L3-4. With the hypertrophic  ligamentum flavum affecting the nerve roots at both segments bilaterally in  their entry points to the neuroforamen at L4 and L3. L2 did not show  significant neuroforaminal stenosis.   DESCRIPTION OF PROCEDURE:  After adequate general anesthesia, the patient in  the knee chest position, Andrews frame, standard preoperative antibiotics,  standard prep with duraprep solution, draped in the usual manner, iodine  Vidrape was used. The incision made at the expected L2 to S1 level near the  skin  and subcu layers in the midline after infiltration with Marcaine 0.5%  with 1:200,000 epinephrine approximately 20 cc. The incision through skin  and subcu layers down to the lumbodorsal fascia incised on both sides along  the dorsal fascia at the expected L4-L5 level and L3 levels. Clamps placed  over the spinous process. The lower clamp seemed to be at the upper end of  L3, upper clamp at the upper end of L4 at the L3-4 interval and the next  clamp up at the L3 level. Two Cobbs were used to carefully elevate the  paralumbar muscles off the posterior elements extending from the L5 sinus  process upwards to the L2 spinous process. The cerebellar retractors  inserted, bleeders controlled using electrocautery. Each of the previous  areas of clamping were marked using a Beyer rongeur making a mark into the  bone. Starting at the L4 level, the spinous process of L4 was resected  piecemeal using a Leksell rongeur and then this was continued downwards and  the spinous process of L5 removed and then the  spinous process of L3. The  posterior aspect of the lamina was then resected fitting it through the  superficial cortex into the interlaminar table at each level. Kerrisons were  then used to perform the initial central laminectomy underneath the lamina  of L5 continuing superiorly under the lamina of L4 and then under 3  resecting the most centrally. I then continued from cranial to caudal  extending the laminotomy outwards to each side to the medial aspect of the  facet. The high-speed __________ bur  was used to carefully first thin the  posterior elements bilaterally at L3, L4 and L5 and then 3 and 4 mm  Kerrisons were then used to resect the bone further out to the lateral  recesses bilaterally. At this point then ligamentum flavum was resected off  the medial aspect of the facets at the L2-3, L3-4 and L4-5 levels. A severe  amount of entrapment of the L5 and S1 nerve roots were identified. Medial  facetectomies performed at the L5-S1 level approximately 20% in order to  finally remove spurs that were impressing upon the S1 nerve roots and the L5  nerve roots. Following resection and this with the ligamentum flavum at the  L5-S1 level and the thecal sac felt to be completely decompressed. The  hockey stick probe could be passed out the S1 and L5 nerve roots both side  demonstrating nerve root and nerve compression. The ligamentum flavum was  then resected at the L4-5 level bilaterally. The resecting hypertrophic  flavum that was affecting the L4 nerve root at its entry into the L4  neuroforamen as well as the L5 neuroforamen. This decompressed lateral  recess nicely and also at L3-4 affecting the L4 nerve roots and L3 nerve  roots. Each of the nerves at L3, L4 and L5 bilaterally were observed to be  exiting freely and any hypertrophic flavum was carefully resected from the  anterior surface of the joints at each segment using 3 and 4 mm Kerrisons such that a hockey neuroprobe could  then be passed out the neuroforamen  above and below the nerve root at each level. The L2 nerve roots were felt  to be decompressed with resection of ligamentum flavum off the undersurface  of the lamina at L2 and the ability to pass a hockey stick neuroprobe out  each of the L2 neuroforamen without impingement. With this, the operating  room microscope was brought  onto the field and this was also used to  ascertain that each of the nerve roots were completely free. Bone wax was  then applied to bleeding cancellous bone surfaces, excess bone wax removed.  Irrigation was performed using copious amounts of irrigant solution.  Thrombin soaked Gelfoam was the placed over the laminotomy defect extending  from L2 to S1. A medium Hemovac drain was then placed to the depth of the  wound and the incision centrally. The paralumbar muscles were then  approximated loosely in the midline using interrupted #1 Vicryl sutures. The  lumbodorsal fascia was then approximated with interrupted simple and figure-  of-eight sutures of #1 Vicryl. The deep subcu layers were reapproximated  with interrupted #1 and #0 Vicryl sutures, the more superficial layers with  interrupted 2-0 Vicryl suture and the skin closed with running subcu stitch  of 4-0 Vicryl, tincture Benzoin and Steri-Strips applied. The patient was  then reactivated after placing her in the supine position and then extubated  and returned to the recovery room in satisfactory condition. All instrument  and sponge counts were correct. The 4 x 4s and ABD pad were fixed to the  skin with hypofix tape at the end of the case following the application of  Steri-Strips.                                                Kerrin Champagne, M.D.    Myra Rude  D:  01/16/2002  T:  01/20/2002  Job:  [id number]

## 2010-10-29 NOTE — Discharge Summary (Signed)
NAME:  [name], [name]                  ACCOUNT NO.:  [id number]   MEDICAL RECORD NO.:  [id number]          PATIENT TYPE:  INP   LOCATION:  4741                         FACILITY:  [facility]   PHYSICIAN:  Loreta Ave, M.D. DATE OF BIRTH:  Oct 21, 1934   DATE OF ADMISSION:  02/09/2004  DATE OF DISCHARGE:  02/13/2004                                 DISCHARGE SUMMARY   ADMISSION DIAGNOSIS:  Loose right tibial component status post total knee  replacement.   DISCHARGE DIAGNOSES:  1.  Loose right tibial component status post total knee replacement.  2.  History of cardiac dysrhythmia.  3.  Hypertension.   PROCEDURE:  Revision total knee replacement.   HISTORY OF PRESENT ILLNESS:  A very pleasant 76 year old female, status post  right total knee replacement in May of 2004.  This was performed by another  orthopedic surgeon in the [place], [place], area.  She has had  continued pain and discomfort in her knee.  Bone scan was positive for  loosening of the tibial component.  She is now indicated for revision of  total knee on the right.   HOSPITAL COURSE:  A 76 year old female admitted February 09, 2004, after  appropriate laboratory studies were obtained as well as 1 g Ancef IV on call  to the operating room.  She was taken to the operating room where she  underwent a revision of the right total knee replacement.  She tolerated the  procedure well.  She was placed preoperatively and postoperatively on Ancef  1 g IV q.8h. x3 doses.  Heparin 5000 units subcutaneous q.12h. was begun  until Coumadin became therapeutic.  Foley was placed intraoperatively.  Consultation with PT, OT and rehab were made.  Ambulate weightbearing as  tolerated on the right.  PT for ambulation and weightbearing as tolerated on  the right.  CPM 0 to 30 degrees for eight to 10 hours a day and increasing  by 10 degrees a day.  She had some difficulty with a tachycardia and had a  consultation by cardiology.  She  was placed on telemetry on February 09, 2004.  She was started on Lopressor 5 mg IV and then repeated in 15 minutes until  her heart rate became less than 110.  The tachycardia did improve and she  was then transferred back to the orthopedic floor on February 10, 2004.  Unfortunately, she had developed some tightness in her chest on February 11, 2004.  At that time, Maalox was given and a stat EKG was obtained.  The EKG  revealed the return of the tachycardia.  She was then transferred back to  telemetry under Dr. Michaelle Copas care.  The remainder of her hospital course had  been improved using chemical means of breaking her tachycardia.  There were  questions as to whether some type of ablation would be necessary in the  future.  It was felt that she could resume her physical therapy and once she  was ambulatory and stable, she was discharged on February 13, 2004, to  return back to our office for  follow-up as well as back to cardiology for  discussion with Dr. Duke Salvia about possible cardiac catheterization  ablation type procedure. Discharged in improved condition.   EKG of February 03, 2004, revealed normal sinus rhythm with T-wave changes,  consider anterior ischemia.  February 09, 2004, reveals a sinus tachycardia  with first degree AV block with rightward axis.  T-wave abnormality.  Consider inferior ischemia.  On February 10, 2004, it showed sinus rhythm with  sinus arrhythmia with first degree AV block with nonspecific T-wave  abnormalities.  Radiographic studies of February 09, 2004, revealed  satisfactory appearance of a total knee replacement on the right.  Distal  aspect of the tibial component and is not included on this view.   LABORATORY DATA:  Hemoglobin 14.1, hematocrit 41.9, white count 5500,  platelets 210,000.  Discharge hemoglobin 9.0, hematocrit 28.5%, white count  5400, platelets 485,000.  Discharge protime was 19.5 with an INR of 2.0.  Preoperative chemistries showed sodium  140, potassium 3.8, chloride 107, CO2  22, glucose 106, BUN 15 creatinine 1.1, calcium 8.8.  Total protein 7.3,  albumin 3.8, AST 31, ALT 17, ALP 67, total bilirubin 0.5.  Discharge sodium  140, potassium 3.4, chloride 111, CO2 24, glucose 87, BUN 4, creatinine 0.8,  calcium 8.3.  CK of February 09, 2004, was 331 with MB 4.2, index 1.3,  troponin I 0.02.  BNP was 208.1.  Repeat enzymes of February 10, 2004, showed  CK of 332 with MB of 32 and index of 1.0, troponin 0.04.  Urinalysis of  February 03, 2004, reveals 3 to 6 whites, 11 to 20 reds, no bacteria seen.  Moderate amount of hemoglobin.  Synovial fluid analysis of February 09, 2004,  intraoperative revealed amber color which is cloudy, white cells 42,  neutrophils 7, lymphs 28, 65 monocytes and __________ and no crystals.   DISCHARGE INSTRUCTIONS:  Given a prescription for Percocet 5/325 one or two  tablets every four hours as needed for pain.  Coumadin 5 mg as directed by  [place].  She will start at one half tablet at night.  Cardizem CT 120 mg  daily. Nadolol 40 mg daily.  Not to take any Toprol.  Colace 100 mg b.i.d.  Iron sulfate 325 b.i.d.   ACTIVITY:  As taught in PT.   DIET:  No restrictions on diet.   WOUND CARE:  Keep her wound clean and dry and cover daily with 4x4s.  She  may shower.  Follow the blue instruction sheet.   FOLLOW UP:  Follow back up with Dr. Eulah Pont in 10 days.  She will schedule an  office visit on March 17, 2004, at 12:15 p.m. with Duke Salvia, M.D.   CONDITION ON DISCHARGE:  Discharged in improved condition.      Bria   BDP/MEDQ  D:  03/05/2004  T:  03/06/2004  Job:  [id number]

## 2010-10-29 NOTE — Discharge Summary (Signed)
NAME:  [name], [name]                            ACCOUNT NO.:  [id number]   MEDICAL RECORD NO.:  [id number]                   PATIENT TYPE:  INP   LOCATION:  5029                                 FACILITY:  [facility]   PHYSICIAN:  Kerrin Champagne, M.D.                DATE OF BIRTH:  10-07-1934   DATE OF ADMISSION:  01/16/2002  DATE OF DISCHARGE:  01/19/2002                                 DISCHARGE SUMMARY   ADMISSION DIAGNOSIS:  Lumbar spinal stenosis   DISCHARGE DIAGNOSIS:  Lumbar spinal stenosis status post lumbar laminectomy  L3-4, L4-5, L5-S1.   PROCEDURES:  The patient was taken to the operating room on 01/16/2002 and  underwent central laminectomy at L3-4, L4-5, and L5-S1.  Surgeon was Dr.  Vira Browns.  Assistant was [employer], P.A.-C.  Surgery was done under  general anesthesia, and a Hemovac drain x 1 was placed at the time of  surgery.   BRIEF HISTORY:  This is a 76 year old female with continued problems with  low back radiation to lower extremity.  She has had progressive problems  with degenerative disk disease and spinal stenosis which have been well  documented by study.  She has weakness developing with dorsiflexion of her  left foot.  She has been tried on analgesics as well as anti-inflammatories,  and unfortunately this has not helped. Due to stenosis at L2-3 with  moderately severe L3-4 and L4-5 foraminal narrowing seen at L5-S1, it was  felt this patient would benefit from surgery intervention and is being  admitted for central laminectomy at L3-4, L4-5,  and L5-S1.   LABORATORY DATA:  CBC on admission showed hm10.3, hematocrit 30.9, white  blood cell count 4.2, red blood cell count 3.41.  Serial hemoglobin and  hematocrit were followed throughout hospital stay.  Hemoglobin and  hematocrit had declined to 8.8 and 25.8 at the time of discharge.  Differential all within normal limits.  Coagulation studies on admission all  within normal limits.  Routine  chemistries on admission all within normal  limits.  Urinalysis on admission was all within normal limits.  The  patient's blood type is A positive with antibody screen negative.   EKG reveals sinus bradycardia with sinus arrhythmia with low voltage QRS and  septal infarct, age undetermined.   Preoperative chest x-ray revealed stable borderline cardiomegaly, no active  disease.   HOSPITAL COURSE:  The patient was admitted to [facility] and taken  to the operating room where she underwent the above-stated procedure without  complications.  The patient tolerated the  procedure well and was taken to  the recovery room and then to the orthopedic floor to continue postoperative  care.  Hemovac drain was placed at the time of surgery.  It was pulled on  postoperative day #1.   On postoperative day #1, the patient was resting comfortably.  She became  nauseated when she got out of physical therapy.  Diet was advanced to clear  liquids.   On 01/18/2002, postoperative day #2, the patient was doing well, still  complaining of slight nausea with positive bowel sounds, no flatus.  She  continued PT, clear liquid diet, and Foley was discontinued on this day.   On 01/19/2002, the patient was feeling much better and was wanting to go home.  She had passed positive flatus this a.m.  Hematocrit was stable, and the  patient was discharged home on 01/19/2002.    DISCHARGE MEDICATIONS:  1. Trinsicon, #30.  2. Peri-Colace, #10.  3. Vicodin, #25, one to two p.o. q.4-6h. p.r.n.  4. Robaxin, #21, p.o. q.8h. p.r.n.   DIET:  As tolerated.   ACTIVITY:  Out of bed as tolerated with assistance and brace.   FOLLOW UP:  Two weeks from day of surgery, she should call [phone] for an  appointment.   CONDITION ON DISCHARGE:  Stable, improved.       Clarene Reamer, PA-C                      Kerrin Champagne, M.D.    SW/MEDQ  D:  02/04/2002  T:  02/05/2002  Job:  [id number]

## 2010-12-27 ENCOUNTER — Other Ambulatory Visit: Payer: Self-pay | Admitting: Sports Medicine

## 2010-12-27 DIAGNOSIS — M549 Dorsalgia, unspecified: Secondary | ICD-10-CM

## 2010-12-29 ENCOUNTER — Ambulatory Visit
Admission: RE | Admit: 2010-12-29 | Discharge: 2010-12-29 | Disposition: A | Discharge status: Home / Self Care | Payer: Self-pay | Source: Ambulatory Visit | Attending: Sports Medicine | Admitting: Sports Medicine

## 2010-12-29 ENCOUNTER — Other Ambulatory Visit: Payer: Self-pay | Admitting: Sports Medicine

## 2010-12-29 DIAGNOSIS — M549 Dorsalgia, unspecified: Secondary | ICD-10-CM

## 2010-12-29 MED ORDER — METHYLPREDNISOLONE ACETATE 40 MG/ML INJ SUSP (RADIOLOG
120.0000 mg | Freq: Once | INTRAMUSCULAR | Status: AC
  Administered 2010-12-29: 120 mg via INTRA_ARTICULAR

## 2010-12-29 MED ORDER — IOHEXOL 180 MG/ML  SOLN
1.0000 mL | Freq: Once | INTRAMUSCULAR | Status: AC | PRN
  Administered 2010-12-29: 1 mL via INTRA_ARTICULAR

## 2011-12-16 ENCOUNTER — Other Ambulatory Visit: Payer: Self-pay | Admitting: Sports Medicine

## 2011-12-16 DIAGNOSIS — IMO0002 Reserved for concepts with insufficient information to code with codable children: Secondary | ICD-10-CM

## 2011-12-20 ENCOUNTER — Other Ambulatory Visit: Payer: Self-pay

## 2011-12-22 ENCOUNTER — Ambulatory Visit
Admission: RE | Admit: 2011-12-22 | Discharge: 2011-12-22 | Disposition: A | Discharge status: Home / Self Care | Payer: Medicare Other | Source: Ambulatory Visit | Attending: Sports Medicine | Admitting: Sports Medicine

## 2011-12-22 DIAGNOSIS — IMO0002 Reserved for concepts with insufficient information to code with codable children: Secondary | ICD-10-CM

## 2011-12-22 MED ORDER — IOHEXOL 180 MG/ML  SOLN
1.0000 mL | Freq: Once | INTRAMUSCULAR | Status: AC | PRN
  Administered 2011-12-22: 1 mL via EPIDURAL

## 2011-12-22 MED ORDER — METHYLPREDNISOLONE ACETATE 40 MG/ML INJ SUSP (RADIOLOG
120.0000 mg | Freq: Once | INTRAMUSCULAR | Status: AC
  Administered 2011-12-22: 120 mg via EPIDURAL

## 2012-10-12 ENCOUNTER — Telehealth: Payer: Self-pay | Admitting: *Deleted

## 2012-10-12 NOTE — Telephone Encounter (Signed)
Called patient on cell and home phone no answer. Left a message for her to call the office and r/s her appointment that was scheduled for 01-02-13 has been canceled. Cm out of the office.

## 2012-10-15 ENCOUNTER — Telehealth: Payer: Self-pay | Admitting: Nurse Practitioner

## 2012-11-02 ENCOUNTER — Other Ambulatory Visit: Payer: Self-pay | Admitting: *Deleted

## 2012-11-02 ENCOUNTER — Ambulatory Visit (INDEPENDENT_AMBULATORY_CARE_PROVIDER_SITE_OTHER): Payer: Medicare Other | Admitting: Nurse Practitioner

## 2012-11-02 ENCOUNTER — Encounter: Payer: Self-pay | Admitting: Nurse Practitioner

## 2012-11-02 VITALS — BP 117/84 | HR 55 | Ht 63.0 in | Wt 146.0 lb

## 2012-11-02 DIAGNOSIS — F0390 Unspecified dementia without behavioral disturbance: Secondary | ICD-10-CM | POA: Insufficient documentation

## 2012-11-02 DIAGNOSIS — R443 Hallucinations, unspecified: Secondary | ICD-10-CM | POA: Insufficient documentation

## 2012-11-02 DIAGNOSIS — R269 Unspecified abnormalities of gait and mobility: Secondary | ICD-10-CM | POA: Insufficient documentation

## 2012-11-02 NOTE — Progress Notes (Signed)
HPI: Patient returns for followup after her last visit 07/05/2012. She has a history of profound dementia. She is a previous patient of Dr. Sandria Manly. She has now been placed in a facility since last seen and is no longer on any medications from this facility. She was on Seroquel for her hallucinations  but that has been stopped. According to the representative from the skilled facility she has made the adjustment well. She is totally dependent for activities of daily living except she can feed herself. She does not follow any commands today  ROS:  - hallucinations  Physical Exam General: well developed, well nourished, seated, in no evident distress Head: head normocephalic and atraumatic. Oropharynx benign Neck: supple  Cardiovascular: regular rate and rhythm,  Neurologic Exam Mental Status: Awake and alert. MMSE 6/30. Could not name any animals, refused to do depression score  Cranial Nerves: Pupils equal, briskly reactive to light. Extraocular movements full without nystagmus. Does not cooperate forvisual fields. Hearing intact and symmetric to finger snap.  Face, tongue, palate move normally and symmetrically.   Motor: Normal bulk and tone. Appears to have normal strength in all tested extremity muscles. Does not follow commands for testing Sensory.: Unreliable Coordination: Does not follow commands  Gait and Station: Arises from chair with assistance  Stance is wide-based, no assistive device . Unable to heel, toe and tandem walk.  Reflexes: Depressed  and symmetric. Toes downgoing.     ASSESSMENT: Severe dementia, Mini-Mental Status exam 6/30.  She has no medications from this office     PLAN: No followup planned her husbands request  Nilda Riggs, GNP-BC APRN

## 2012-11-02 NOTE — Patient Instructions (Addendum)
Mini-Mental Status exam 6/30, severe dementia Patient is currently in skilled facility and does not receive any medications from [place] at [facility]. No followup is necessary

## 2013-02-08 ENCOUNTER — Inpatient Hospital Stay (HOSPITAL_COMMUNITY)
Admission: EM | Admit: 2013-02-08 | Discharge: 2013-02-10 | DRG: 312 | Disposition: A | Discharge status: Nursing Home (Includes ICF) | Payer: Medicare Other | Attending: Internal Medicine | Admitting: Internal Medicine

## 2013-02-08 ENCOUNTER — Encounter (HOSPITAL_COMMUNITY): Payer: Self-pay

## 2013-02-08 DIAGNOSIS — Z79899 Other long term (current) drug therapy: Secondary | ICD-10-CM

## 2013-02-08 DIAGNOSIS — I498 Other specified cardiac arrhythmias: Secondary | ICD-10-CM

## 2013-02-08 DIAGNOSIS — I10 Essential (primary) hypertension: Secondary | ICD-10-CM

## 2013-02-08 DIAGNOSIS — R55 Syncope and collapse: Principal | ICD-10-CM

## 2013-02-08 DIAGNOSIS — R443 Hallucinations, unspecified: Secondary | ICD-10-CM

## 2013-02-08 DIAGNOSIS — F0390 Unspecified dementia without behavioral disturbance: Secondary | ICD-10-CM

## 2013-02-08 DIAGNOSIS — R001 Bradycardia, unspecified: Secondary | ICD-10-CM | POA: Diagnosis present

## 2013-02-08 DIAGNOSIS — R269 Unspecified abnormalities of gait and mobility: Secondary | ICD-10-CM

## 2013-02-08 DIAGNOSIS — T448X5A Adverse effect of centrally-acting and adrenergic-neuron-blocking agents, initial encounter: Secondary | ICD-10-CM | POA: Diagnosis present

## 2013-02-08 HISTORY — DX: Essential (primary) hypertension: I10

## 2013-02-08 HISTORY — DX: Chronic kidney disease, unspecified: N18.9

## 2013-02-08 HISTORY — DX: Other spondylosis with myelopathy, cervical region: M47.12

## 2013-02-08 LAB — COMPREHENSIVE METABOLIC PANEL
ALT: 14 U/L (ref 0–35)
AST: 25 U/L (ref 0–37)
Alkaline Phosphatase: 66 U/L (ref 39–117)
CO2: 27 mEq/L (ref 19–32)
Chloride: 104 mEq/L (ref 96–112)
Creatinine, Ser: 1.09 mg/dL (ref 0.50–1.10)
GFR calc non Af Amer: 48 mL/min — ABNORMAL LOW (ref >90)
Potassium: 3.8 mEq/L (ref 3.5–5.1)
Total Bilirubin: 0.2 mg/dL — ABNORMAL LOW (ref 0.3–1.2)

## 2013-02-08 LAB — CBC WITH DIFFERENTIAL/PLATELET
Basophils Absolute: 0 10*3/uL (ref 0.0–0.1)
HCT: 34.1 % — ABNORMAL LOW (ref 36.0–46.0)
Hemoglobin: 11.3 g/dL — ABNORMAL LOW (ref 12.0–15.0)
Lymphocytes Relative: 20 % (ref 12–46)
Monocytes Absolute: 0.5 10*3/uL (ref 0.1–1.0)
Neutro Abs: 4.3 10*3/uL (ref 1.7–7.7)
Neutrophils Relative %: 70 % (ref 43–77)
RDW: 14 % (ref 11.5–15.5)
WBC: 6.1 10*3/uL (ref 4.0–10.5)

## 2013-02-08 LAB — GLUCOSE, CAPILLARY: Glucose-Capillary: 105 mg/dL — ABNORMAL HIGH (ref 70–99)

## 2013-02-08 MED ORDER — LORAZEPAM 2 MG/ML IJ SOLN
0.5000 mg | Freq: Four times a day (QID) | INTRAMUSCULAR | Status: DC | PRN
  Administered 2013-02-09: 0.5 mg via INTRAVENOUS
  Filled 2013-02-08: qty 1

## 2013-02-08 MED ORDER — DIPHENHYDRAMINE HCL 50 MG/ML IJ SOLN
12.5000 mg | Freq: Two times a day (BID) | INTRAMUSCULAR | Status: DC
  Administered 2013-02-08: 12.5 mg via INTRAVENOUS
  Filled 2013-02-08 (×4): qty 0.25

## 2013-02-08 MED ORDER — ONDANSETRON HCL 4 MG/2ML IJ SOLN: 4.0000 mg | Freq: Four times a day (QID) | INTRAMUSCULAR | Status: DC | PRN

## 2013-02-08 MED ORDER — DIPHENHYDRAMINE HCL 50 MG/ML IJ SOLN
INTRAMUSCULAR | Status: AC
  Filled 2013-02-08: qty 1

## 2013-02-08 MED ORDER — ACETAMINOPHEN 325 MG PO TABS: 650.0000 mg | ORAL_TABLET | Freq: Four times a day (QID) | ORAL | Status: DC | PRN

## 2013-02-08 MED ORDER — HALOPERIDOL LACTATE 5 MG/ML IJ SOLN
2.5000 mg | Freq: Once | INTRAMUSCULAR | Status: AC
  Filled 2013-02-08: qty 1

## 2013-02-08 MED ORDER — HALOPERIDOL LACTATE 5 MG/ML IJ SOLN
2.5000 mg | Freq: Once | INTRAMUSCULAR | Status: AC
  Administered 2013-02-08: 2.5 mg via INTRAVENOUS

## 2013-02-08 MED ORDER — SODIUM CHLORIDE 0.9 % IV SOLN
INTRAVENOUS | Status: DC
Start: 2013-02-08 — End: 2013-02-10
  Administered 2013-02-08 – 2013-02-09 (×2): via INTRAVENOUS

## 2013-02-08 MED ORDER — OXYCODONE HCL 5 MG PO TABS: 5.0000 mg | ORAL_TABLET | ORAL | Status: DC | PRN

## 2013-02-08 MED ORDER — HALOPERIDOL LACTATE 5 MG/ML IJ SOLN
2.5000 mg | Freq: Once | INTRAMUSCULAR | Status: AC
  Administered 2013-02-08: 2.5 mg via INTRAMUSCULAR

## 2013-02-08 MED ORDER — ONDANSETRON HCL 4 MG PO TABS: 4.0000 mg | ORAL_TABLET | Freq: Four times a day (QID) | ORAL | Status: DC | PRN

## 2013-02-08 MED ORDER — ACETAMINOPHEN 650 MG RE SUPP: 650.0000 mg | Freq: Four times a day (QID) | RECTAL | Status: DC | PRN

## 2013-02-08 MED ORDER — TORSEMIDE 20 MG PO TABS
20.0000 mg | ORAL_TABLET | Freq: Every day | ORAL | Status: DC
  Filled 2013-02-08: qty 1

## 2013-02-08 MED ORDER — HALOPERIDOL LACTATE 5 MG/ML IJ SOLN
2.0000 mg | Freq: Four times a day (QID) | INTRAMUSCULAR | Status: DC | PRN
  Administered 2013-02-09: 2 mg via INTRAVENOUS
  Filled 2013-02-08: qty 0.4

## 2013-02-08 MED ORDER — LORAZEPAM 2 MG/ML IJ SOLN
INTRAMUSCULAR | Status: AC
  Administered 2013-02-08: 0.5 mg
  Filled 2013-02-08: qty 1

## 2013-02-08 MED ORDER — QUETIAPINE FUMARATE 50 MG PO TABS
50.0000 mg | ORAL_TABLET | Freq: Two times a day (BID) | ORAL | Status: DC
  Administered 2013-02-09 – 2013-02-10 (×2): 50 mg via ORAL
  Filled 2013-02-08 (×4): qty 1

## 2013-02-08 MED ORDER — ENOXAPARIN SODIUM 30 MG/0.3ML ~~LOC~~ SOLN
30.0000 mg | SUBCUTANEOUS | Status: DC
  Administered 2013-02-09 – 2013-02-10 (×2): 30 mg via SUBCUTANEOUS
  Filled 2013-02-08 (×2): qty 0.3

## 2013-02-08 MED ORDER — HYDROMORPHONE HCL PF 1 MG/ML IJ SOLN: 0.5000 mg | INTRAMUSCULAR | Status: DC | PRN

## 2013-02-08 MED ORDER — ALUM & MAG HYDROXIDE-SIMETH 200-200-20 MG/5ML PO SUSP: 30.0000 mL | Freq: Four times a day (QID) | ORAL | Status: DC | PRN

## 2013-02-08 MED ORDER — SIMVASTATIN 20 MG PO TABS
20.0000 mg | ORAL_TABLET | Freq: Every day | ORAL | Status: DC
  Filled 2013-02-08 (×2): qty 1

## 2013-02-08 MED ORDER — CALCIUM CARBONATE-VITAMIN D 500-200 MG-UNIT PO TABS
1.0000 | ORAL_TABLET | Freq: Every day | ORAL | Status: DC
  Administered 2013-02-09 – 2013-02-10 (×2): 1 via ORAL
  Filled 2013-02-08 (×3): qty 1

## 2013-02-08 NOTE — ED Notes (Addendum)
Pt alert, NAD, calm, interactive, resps e/u, VSS, SB on monitor, BP low, family x3 at [facility], [facility], family & pt updated, aware of plan to be admitted per Dr. Radford Pax.

## 2013-02-08 NOTE — ED Notes (Signed)
Patient will need a sitter for safety. Notified AC for request. Patient will have a safety sitter on 3W.

## 2013-02-08 NOTE — ED Provider Notes (Signed)
CSN: [id number]     Arrival date & time 02/08/13  1854 History   First MD Initiated Contact with Patient 02/08/13 1914     Chief Complaint  Patient presents with  . Syncope     HPI Patient from [place] assisted living via [facility]. Was sitting at dinner today and had a brief episode of altered mental status. Patient back to neuro baseline at EMS arrival. Hx dementia. CBG 94. 12 lead Sinus Huston Foley with 1st degree heart block. 20g left hand. Patient has no complaints.   Past Medical History  Diagnosis Date  . Hallucinations   . Abnormality of gait   . Spondylosis, cervical, with myelopathy   . Dementia    History reviewed. No pertinent past surgical history. No family history on file. History  Substance Use Topics  . Smoking status: Never Smoker   . Smokeless tobacco: Never Used  . Alcohol Use: No   OB History   Grav Para Term Preterm Abortions TAB SAB Ect Mult Living                 Review of Systems  Unable to perform ROS: Dementia    Allergies  Review of patient's allergies indicates no known allergies.  Home Medications   Current Outpatient Rx  Name  Route  Sig  Dispense  Refill  . acetaminophen (TYLENOL) 500 MG tablet   Oral   Take 500 mg by mouth every 4 (four) hours as needed for pain.         . calcium-vitamin D (OSCAL WITH D) 500-200 MG-UNIT per tablet   Oral   Take 1 tablet by mouth daily with breakfast.         . guaiFENesin (ROBITUSSIN) 100 MG/5ML SOLN   Oral   Take 10 mLs by mouth every 4 (four) hours as needed (cough).         . haloperidol (HALDOL) 0.5 MG tablet   Oral   Take 0.5 mg by mouth every 4 (four) hours as needed (agitation).         Marland Kitchen lidocaine (LIDODERM) 5 %   Transdermal   Place 1 patch onto the skin every 12 (twelve) hours.          Marland Kitchen loperamide (IMODIUM A-D) 2 MG tablet   Oral   Take 2 mg by mouth 4 (four) times daily as needed for diarrhea or loose stools.         . magnesium hydroxide (MILK OF  MAGNESIA) 400 MG/5ML suspension   Oral   Take 30 mLs by mouth daily as needed for constipation.         . metoprolol tartrate (LOPRESSOR) 25 MG tablet   Oral   Take 25 mg by mouth 2 (two) times daily.          . Multiple Vitamins-Minerals (MULTIVITAMIN WITH MINERALS) tablet   Oral   Take 1 tablet by mouth daily.         . pravastatin (PRAVACHOL) 40 MG tablet   Oral   Take 40 mg by mouth daily.          Marland Kitchen torsemide (DEMADEX) 20 MG tablet   Oral   Take 20 mg by mouth daily.           BP 129/43  Pulse 50  Temp(Src) 98.1 F (36.7 C) (Oral)  Resp 25  SpO2 100% Physical Exam  Nursing note and vitals reviewed. Constitutional: She is oriented to person, place, and time.  She appears well-developed and well-nourished. No distress.  HENT:  Head: Normocephalic and atraumatic.  Eyes: Pupils are equal, round, and reactive to light.  Neck: Normal range of motion.  Cardiovascular: Intact distal pulses.  Bradycardia present.   Pulmonary/Chest: No respiratory distress.  Abdominal: Normal appearance. She exhibits no distension. There is no tenderness. There is no rebound.  Musculoskeletal: Normal range of motion.  Neurological: She is alert and oriented to person, place, and time. She has normal strength. No cranial nerve deficit.  Skin: Skin is warm and dry. No rash noted.  Psychiatric: She has a normal mood and affect. Her behavior is normal.    ED Course  Procedures (including critical care time) Labs Review Labs Reviewed  GLUCOSE, CAPILLARY - Abnormal; Notable for the following:    Glucose-Capillary 105 (*)    All other components within normal limits  CBC WITH DIFFERENTIAL - Abnormal; Notable for the following:    RBC 3.84 (*)    Hemoglobin 11.3 (*)    HCT 34.1 (*)    Platelets 131 (*)    All other components within normal limits  COMPREHENSIVE METABOLIC PANEL - Abnormal; Notable for the following:    Glucose, Bld 105 (*)    Total Bilirubin 0.2 (*)    GFR calc  non Af Amer 48 (*)    GFR calc Af Amer 56 (*)    All other components within normal limits  BASIC METABOLIC PANEL  CBC  TSH  TROPONIN I  TROPONIN I  TROPONIN I  POCT I-STAT TROPONIN I   Date: 02/08/2013  Rate: 49  Rhythm: normal sinus rhythm  QRS Axis: normal  Intervals: normal  ST/T Wave abnormalities: normal  Conduction Disutrbances: First degree block  Narrative Interpretation: Abnormal EKG  Plan: Admission the telemetry bed and evaluation of bradycardia dysrhythmia.   Imaging Review No results found.  MDM   1. Syncope   2. Bradycardia        Nelia Shi, MD 02/08/13 2209

## 2013-02-08 NOTE — ED Notes (Signed)
Patient sleeping comfortably. SL IV secured with gauze. Flushes well. Family at bedside.

## 2013-02-08 NOTE — H&P (Signed)
[facility] History and Physical  [name]:[id number] DOB: 17-Jan-1935 DOA: 02/08/2013  Referring physician:  EDP PCP: Tanya Hoff, MD  Specialists:   Chief Complaint:   HPI: [name] is a 78 y.o. female who was eating dinner accompanied by her daughter at her Asst. Living Faciity when she was witnessed as having a episode of blank staring and unresponsiveness with her eyes rolled back into her head.  She was unresponsive for about 5 minutes staff called EMS and she was brought to the ED at [facility].   She was found to have a  Bradycardiac Heart rate  In the 40's.   In the ED, she was very combative and agitated.       Review of Systems:  Unable to Obtain from patient     Past Medical History  Diagnosis Date  . Hallucinations   . Abnormality of gait   . Spondylosis, cervical, with myelopathy   . Dementia   . Hypertension     Past Surgical History  Procedure Laterality Date  . Lumbar laminectomy    . Knee arthroscopy    . C-spine      Prior to Admission medications   Medication Sig Start Date End Date Taking? Authorizing Provider  acetaminophen (TYLENOL) 500 MG tablet Take 500 mg by mouth every 4 (four) hours as needed for pain.   Yes Historical Provider, MD  calcium-vitamin D (OSCAL WITH D) 500-200 MG-UNIT per tablet Take 1 tablet by mouth daily with breakfast.   Yes Historical Provider, MD  guaiFENesin (ROBITUSSIN) 100 MG/5ML SOLN Take 10 mLs by mouth every 4 (four) hours as needed (cough).   Yes Historical Provider, MD  haloperidol (HALDOL) 0.5 MG tablet Take 0.5 mg by mouth every 4 (four) hours as needed (agitation).   Yes Historical Provider, MD  lidocaine (LIDODERM) 5 % Place 1 patch onto the skin every 12 (twelve) hours.  09/24/12  Yes Historical Provider, MD  loperamide (IMODIUM A-D) 2 MG tablet Take 2 mg by mouth 4 (four) times daily as needed for diarrhea or loose stools.   Yes Historical Provider, MD  magnesium hydroxide (MILK OF MAGNESIA) 400  MG/5ML suspension Take 30 mLs by mouth daily as needed for constipation.   Yes Historical Provider, MD  metoprolol tartrate (LOPRESSOR) 25 MG tablet Take 25 mg by mouth 2 (two) times daily.  10/18/12  Yes Historical Provider, MD  Multiple Vitamins-Minerals (MULTIVITAMIN WITH MINERALS) tablet Take 1 tablet by mouth daily.   Yes Historical Provider, MD  pravastatin (PRAVACHOL) 40 MG tablet Take 40 mg by mouth daily.  10/18/12  Yes Historical Provider, MD  torsemide (DEMADEX) 20 MG tablet Take 20 mg by mouth daily.  10/18/12  Yes Historical Provider, MD    No Known Allergies  Social History:  reports that she has never smoked. She has never used smokeless tobacco. She reports that she does not drink alcohol or use illicit drugs.     Family History  Problem Relation Age of Onset  . Hypertension Mother   . Hypertension Father     (be sure to complete)   Physical Exam:  GEN:  Agitated and combative thin well developed Elderly 78 y.o. African American female  examined  and in no acute distress; cooperative with exam Filed Vitals:   02/08/13 2030 02/08/13 2045 02/08/13 2100 02/08/13 2130  BP: 150/126 126/49 96/41 129/43  Pulse: 51 48 48 50  Temp:      TempSrc:  Resp: 14 16 18 25   SpO2: 98% 100% 100% 100%   Blood pressure 129/43, pulse 50, temperature 98.1 F (36.7 C), temperature source Oral, resp. rate 25, SpO2 100.00%. PSYCH: She is alert and oriented x1; does not appear anxious does not appear depressed; affect is normal HEENT: Normocephalic and Atraumatic, Mucous membranes pink; PERRLA; EOM intact; Fundi:  Benign;  No scleral icterus, Nares: Patent, Oropharynx: Clear,  Fair Dentition, Neck:  FROM, no cervical lymphadenopathy nor thyromegaly or carotid bruit; no JVD; Breasts:: Not examined CHEST WALL: No tenderness CHEST: Normal respiration, clear to auscultation bilaterally HEART: Regular rate and rhythm; no murmurs rubs or gallops BACK: No kyphosis or scoliosis; no CVA  tenderness ABDOMEN: Positive Bowel Sounds, soft non-tender; no masses, no organomegaly.    EXTREMITIES: No cyanosis, clubbing or edema; no ulcerations. Genitalia: not examined PULSES: 2+ and symmetric SKIN: Normal hydration no rash or ulceration CNS: Cranial nerves 2-12 grossly intact no focal neurologic deficit    Labs on Admission:  Basic Metabolic Panel:  Recent Labs Lab 02/08/13 1946  NA 142  K 3.8  CL 104  CO2 27  GLUCOSE 105*  BUN 16  CREATININE 1.09  CALCIUM 9.4   Liver Function Tests:  Recent Labs Lab 02/08/13 1946  AST 25  ALT 14  ALKPHOS 66  BILITOT 0.2*  PROT 6.7  ALBUMIN 3.5   No results found for this basename: LIPASE, AMYLASE,  in the last 168 hours No results found for this basename: AMMONIA,  in the last 168 hours CBC:  Recent Labs Lab 02/08/13 1946  WBC 6.1  NEUTROABS 4.3  HGB 11.3*  HCT 34.1*  MCV 88.8  PLT 131*   Cardiac Enzymes: No results found for this basename: CKTOTAL, CKMB, CKMBINDEX, TROPONINI,  in the last 168 hours  BNP (last 3 results) No results found for this basename: PROBNP,  in the last 8760 hours CBG:  Recent Labs Lab 02/08/13 1912  GLUCAP 105*    Radiological Exams on Admission: No results found.   EKG: Independently reviewed. bradycardia   Assessment/Plan Principal Problem:   Syncope Active Problems:   Bradycardia   Hypertension   Dementia   1.   Syncope-   Syncope workup. Monitor  On telemetry, may have occurred due to bradycardia, discontinue B-blocker.     2.   Bradycardia-  Monitor, discontinue B-Blocker.   Check Troponins.      3.   HTN-     4.  Dementia-Acting out , Haldol IM PRN, and Ativan IV PRN.    , 1:1 Sitter for Patient safety.     5.   DVT prophylaxis.          Code Status:      FULL CODE Family Communication:     Disposition Plan:         Time spent: 60 minutes  Ron Parker [facility] Pager [phone]  If 7PM-7AM, please contact  night-coverage [email or web address] Password [email or web address] 02/08/2013, 10:26 PM

## 2013-02-08 NOTE — ED Notes (Signed)
MD at bedside. 

## 2013-02-08 NOTE — ED Notes (Signed)
Phlebotomy at bedside.

## 2013-02-08 NOTE — ED Notes (Signed)
Patient is up and confused swinging at people and attempting to bite.  Haldol administered, patient up and walking around the room

## 2013-02-08 NOTE — ED Notes (Signed)
Per daughter, patient was sitting at dinner table, "didn't look right" had a blank stare, head went back, eyes rolled back into head, became diaphoretic and unresponsive. Staff immediately notified and unable to awake patient. EMS notified. By the time of EMS arrival, patient back to baseline. Skin warm and dry. No complaints. Denies CP, SOB, dizziness or headache

## 2013-02-08 NOTE — ED Notes (Signed)
Patient from [facility] assisted living via [facility]. Was sitting at dinner today and had a brief episode of altered mental status. Patient back to neuro baseline at EMS arrival. Hx dementia. CBG 94. 12 lead Sinus Huston Foley with 1st degree heart block. 20g left hand. Patient has no complaints.

## 2013-02-09 LAB — BASIC METABOLIC PANEL
Calcium: 9.2 mg/dL (ref 8.4–10.5)
Creatinine, Ser: 1.03 mg/dL (ref 0.50–1.10)
GFR calc non Af Amer: 52 mL/min — ABNORMAL LOW (ref >90)
Glucose, Bld: 84 mg/dL (ref 70–99)
Sodium: 143 mEq/L (ref 135–145)

## 2013-02-09 LAB — CBC
MCH: 29.5 pg (ref 26.0–34.0)
MCV: 88 fL (ref 78.0–100.0)
Platelets: 132 10*3/uL — ABNORMAL LOW (ref 150–400)
RBC: 3.83 MIL/uL — ABNORMAL LOW (ref 3.87–5.11)
RDW: 13.8 % (ref 11.5–15.5)

## 2013-02-09 LAB — TSH: TSH: 3.205 u[IU]/mL (ref 0.350–4.500)

## 2013-02-09 LAB — TROPONIN I: Troponin I: <0.3 ng/mL (ref <0.30)

## 2013-02-09 NOTE — Progress Notes (Signed)
[facility] PROGRESS NOTE  [name] ZOX:[id number] DOB: 1935-02-26 DOA: 02/08/2013 PCP: Sissy Hoff, MD  Assessment/Plan: Principal Problem:   Syncope Active Problems:   Bradycardia   Hypertension   Dementia    1. Syncope: Patient presented following a syncopal episode while eating dinner, described as blank staring and unresponsiveness with eyes rolled back into her head, then unresponsive for about 5 minutes. No history of previous similar episodes, no involuntary movements, tongue-biting or urinary incontinence. The possibility of a a seizure episode has to be considered. Will work up with brain MRI, EEG. No further episodes have been documented since admission. Patient has no focal neurologic deficit.  2. Sinus Bradycardia: At the time of presentation, HR was in the 40s, and 12-Lead EKG revealed a sinus bradycardia, with no acute ischemic changes. This is likely to be due to beta-blocker, which has now been discontinued. Doubt this degree of bradycardia was the sole culprit for patient's syncope. Cardiac enzymes are unelevated, and patient has no chest pain or SOB. HR is now in the low 50s. Will check TSH. Continue telemetric monitoring.  3. HTN: Controlled.   4. Dementia: Patient was agitated in the ED, requiring 1:1 Sitter. Managing with prn Haldol/Ativan. Calm and cooperative this AM.    Code Status: Full Code.  Family Communication:  Disposition Plan: To be determined.    Brief narrative: 78 y.o. female with known history of HTN, cervical spondylosis/myelopathy, gait abnormality, dementia, hallucinosis, who was eating dinner accompanied by her daughter at her Assisted Living Faciity, when she was witnessed to have an episode of blank staring and unresponsiveness with her eyes rolled back into her head. She was unresponsive for about 5 minutes, then staff called EMS and she was brought to the ED at [facility], where she was found to have a bradycardiac Heart rate In the  40's. In the ED, she was very combative and agitated. Admitted for further evaluation and management.    Consultants:  N/A.   Procedures:  Brain MRI.  EEG.   Antibiotics:  N/A.   HPI/Subjective: No new issues.   Objective: Vital signs in last 24 hours: Temp:  [97.7 F (36.5 C)-98.1 F (36.7 C)] 97.7 F (36.5 C) (08/30 0400) Pulse Rate:  [45-62] 47 (08/30 0756) Resp:  [13-25] 18 (08/30 0756) BP: (96-150)/(29-126) 132/66 mmHg (08/30 0756) SpO2:  [98 %-100 %] 100 % (08/30 0756) Weight:  [66.815 kg (147 lb 4.8 oz)] 66.815 kg (147 lb 4.8 oz) (08/29 2322) Weight change:     Intake/Output from previous day:       Physical Exam: General: Comfortable, sleeping, but easily rousable, and once roused, was calm and cooperative. Following simple commands, not short of breath at rest.  HEENT:  Mild clinical pallor, no jaundice, no conjunctival injection or discharge. Hydration is fair.  NECK:  Supple, JVP not seen, no carotid bruits, no palpable lymphadenopathy, no palpable goiter. CHEST:  Clinically clear to auscultation, no wheezes, no crackles. HEART:  Sounds 1 and 2 heard, bradycardic, normal, regular, no murmurs. ABDOMEN:  Full, soft, non-tender, no palpable organomegaly, no palpable masses, normal bowel sounds. GENITALIA:  Not examined. LOWER EXTREMITIES:  No pitting edema, palpable peripheral pulses. MUSCULOSKELETAL SYSTEM:  Generalized osteoarthritic changes, otherwise, normal. CENTRAL NERVOUS SYSTEM:  No focal neurologic deficit on gross examination.  Lab Results:  Recent Labs  02/08/13 1946 02/09/13 0500  WBC 6.1 5.9  HGB 11.3* 11.3*  HCT 34.1* 33.7*  PLT 131* 132*    Recent Labs  02/08/13 1946 02/09/13 0500  NA 142 143  K 3.8 3.5  CL 104 106  CO2 27 26  GLUCOSE 105* 84  BUN 16 16  CREATININE 1.09 1.03  CALCIUM 9.4 9.2   No results found for this or any previous visit (from the past 240 hour(s)).   Studies/Results: No results  found.  Medications: Scheduled Meds: . calcium-vitamin D  1 tablet Oral Q breakfast  . diphenhydrAMINE      . diphenhydrAMINE  12.5 mg Intravenous BID  . enoxaparin (LOVENOX) injection  30 mg Subcutaneous Q24H  . QUEtiapine  50 mg Oral BID  . simvastatin  20 mg Oral q1800  . torsemide  20 mg Oral Daily   Continuous Infusions: . sodium chloride 75 mL/hr at 02/08/13 2349   PRN Meds:.acetaminophen, acetaminophen, alum & mag hydroxide-simeth, haloperidol lactate, HYDROmorphone (DILAUDID) injection, LORazepam, ondansetron (ZOFRAN) IV, ondansetron, oxyCODONE    LOS: 1 day   [name],[name]  [facility] Pager [phone]. If 8PM-8AM, please contact night-coverage at [email or web address], password [facility] 02/09/2013, 9:04 AM  LOS: 1 day

## 2013-02-09 NOTE — Progress Notes (Signed)
UR Completed.  Rylei Codispoti Jane [phone] 02/09/2013  

## 2013-02-10 ENCOUNTER — Inpatient Hospital Stay (HOSPITAL_COMMUNITY): Payer: Medicare Other

## 2013-02-10 LAB — BASIC METABOLIC PANEL
BUN: 11 mg/dL (ref 6–23)
Calcium: 9.4 mg/dL (ref 8.4–10.5)
Chloride: 106 mEq/L (ref 96–112)
Creatinine, Ser: 0.84 mg/dL (ref 0.50–1.10)
GFR calc Af Amer: 77 mL/min — ABNORMAL LOW (ref >90)
GFR calc non Af Amer: 66 mL/min — ABNORMAL LOW (ref >90)

## 2013-02-10 LAB — CBC
HCT: 35.8 % — ABNORMAL LOW (ref 36.0–46.0)
MCHC: 33.2 g/dL (ref 30.0–36.0)
MCV: 88.4 fL (ref 78.0–100.0)
Platelets: 148 10*3/uL — ABNORMAL LOW (ref 150–400)
RDW: 13.8 % (ref 11.5–15.5)
WBC: 5.8 10*3/uL (ref 4.0–10.5)

## 2013-02-10 LAB — TSH: TSH: 2.021 u[IU]/mL (ref 0.350–4.500)

## 2013-02-10 MED ORDER — AMLODIPINE BESYLATE 5 MG PO TABS: 5.0000 mg | ORAL_TABLET | Freq: Every day | ORAL | Status: DC

## 2013-02-10 MED ORDER — QUETIAPINE FUMARATE 50 MG PO TABS: 50.0000 mg | ORAL_TABLET | Freq: Two times a day (BID) | ORAL | Status: DC

## 2013-02-10 MED ORDER — POTASSIUM CHLORIDE CRYS ER 20 MEQ PO TBCR
40.0000 meq | EXTENDED_RELEASE_TABLET | Freq: Once | ORAL | Status: AC
  Administered 2013-02-10: 40 meq via ORAL
  Filled 2013-02-10: qty 2

## 2013-02-10 MED ORDER — ENOXAPARIN SODIUM 40 MG/0.4ML ~~LOC~~ SOLN: 40.0000 mg | SUBCUTANEOUS | Status: DC

## 2013-02-10 NOTE — Discharge Summary (Signed)
Physician Discharge Summary  [name] ZOX:[id number] DOB: 03-12-1935 DOA: 02/08/2013  PCP: Tanya Hoff, MD  Admit date: 02/08/2013 Discharge date: 02/10/2013  Time spent: 40 minutes  Recommendations for Outpatient Follow-up:  1. Follow up with primary MD.  2. Primary MD is recommended to arrange outpatient EEG.   Discharge Diagnoses:  Principal Problem:   Syncope Active Problems:   Bradycardia   Hypertension   Dementia   Discharge Condition: Satisfactory.  Diet recommendation: Heart-Healthy.  Filed Weights   02/08/13 2322  Weight: 66.815 kg (147 lb 4.8 oz)    History of present illness:  78 y.o. female with known history of HTN, cervical spondylosis/myelopathy, gait abnormality, dementia, hallucinosis, who was eating dinner accompanied by her daughter at her Assisted Living Faciity, when she was witnessed to have an episode of blank staring and unresponsiveness with her eyes rolled back into her head. She was unresponsive for about 5 minutes, then staff called EMS and she was brought to the ED at [facility], where she was found to have a bradycardiac Heart rate In the 40's. In the ED, she was very combative and agitated. Admitted for further evaluation and management.    Hospital Course:  1. Syncope: Patient presented following a syncopal episode while eating dinner, described as blank staring and unresponsiveness with eyes rolled back into her head, then unresponsive for about 5 minutes. No history of previous similar episodes, no involuntary movements, tongue-biting or urinary incontinence. The possibility of a seizure episode had to be considered. She had no focal neurologic deficit, and no further episodes were documented during patient's hospitalization. Brain MRI showed no acute intracranial abnormality. Stable brain since 2009 except for mild generalized volume loss. In view of above, no anti-convulscant medication appears indicated. Primary MD may consider arranging  outpatient EEG. This has been recommended, accordingly. Diuretics have been discontinued.  2. Sinus Bradycardia: At the time of presentation, HR was in the 40s, and 12-Lead EKG revealed a sinus bradycardia, with no acute ischemic changes. This is likely to be due to beta-blocker, which has now been discontinued. Doubt this degree of bradycardia was the sole culprit for patient's syncope. Cardiac enzymes were unelevated, and patient had no chest pain or SOB. As of 02/10/13, HR is now in the low 70s. No arrhythmias were documented on telemetric monitoring. TSH was normal at 2.021.  3. HTN: Reasonable, but sub-optimal, after beta-blocker was discontinued. Have substituted with Norvasc, 5 mg daily.  4. Dementia: Patient was agitated in the ED, requiring 1:1 Sitter. Managed with prn Haldol/Ativan initially, but from 02/09/13 AM, she remained calm and cooperative. Now on Seroquel.    Procedures:  See Below.  Consultations:  N/A.   Discharge Exam: Filed Vitals:   02/10/13 0800  BP: 139/68  Pulse: 74  Temp: 99.9 F (37.7 C)  Resp: 18    General: Comfortable, calm and cooperative, not short of breath at rest.  HEENT: Mild clinical pallor, no jaundice, no conjunctival injection or discharge. Hydration is fair.  NECK: Supple, JVP not seen, no carotid bruits, no palpable lymphadenopathy, no palpable goiter.  CHEST: Clinically clear to auscultation, no wheezes, no crackles.  HEART: Sounds 1 and 2 heard, normal, regular, no murmurs.  ABDOMEN: Full, soft, non-tender, no palpable organomegaly, no palpable masses, normal bowel sounds.  GENITALIA: Not examined.  LOWER EXTREMITIES: No pitting edema, palpable peripheral pulses.  MUSCULOSKELETAL SYSTEM: Generalized osteoarthritic changes, otherwise, normal.  CENTRAL NERVOUS SYSTEM: No focal neurologic deficit on gross examination.  Discharge Instructions  Discharge Orders   Future Orders Complete By Expires   Diet - low sodium heart healthy  As  directed    Increase activity slowly  As directed        Medication List    STOP taking these medications       metoprolol tartrate 25 MG tablet  Commonly known as:  LOPRESSOR     torsemide 20 MG tablet  Commonly known as:  DEMADEX      TAKE these medications       acetaminophen 500 MG tablet  Commonly known as:  TYLENOL  Take 500 mg by mouth every 4 (four) hours as needed for pain.     amLODipine 5 MG tablet  Commonly known as:  NORVASC  Take 1 tablet (5 mg total) by mouth daily.     calcium-vitamin D 500-200 MG-UNIT per tablet  Commonly known as:  OSCAL WITH D  Take 1 tablet by mouth daily with breakfast.     guaiFENesin 100 MG/5ML Soln  Commonly known as:  ROBITUSSIN  Take 10 mLs by mouth every 4 (four) hours as needed (cough).     haloperidol 0.5 MG tablet  Commonly known as:  HALDOL  Take 0.5 mg by mouth every 4 (four) hours as needed (agitation).     lidocaine 5 %  Commonly known as:  LIDODERM  Place 1 patch onto the skin every 12 (twelve) hours.     loperamide 2 MG tablet  Commonly known as:  IMODIUM A-D  Take 2 mg by mouth 4 (four) times daily as needed for diarrhea or loose stools.     magnesium hydroxide 400 MG/5ML suspension  Commonly known as:  MILK OF MAGNESIA  Take 30 mLs by mouth daily as needed for constipation.     multivitamin with minerals tablet  Take 1 tablet by mouth daily.     pravastatin 40 MG tablet  Commonly known as:  PRAVACHOL  Take 40 mg by mouth daily.     QUEtiapine 50 MG tablet  Commonly known as:  SEROQUEL  Take 1 tablet (50 mg total) by mouth 2 (two) times daily.       No Known Allergies Follow-up Information   Schedule an appointment as soon as possible for a visit with Tanya Hoff, MD.   Specialty:  Family Medicine   Contact information:   [address] [place] [address] [phone]        The results of significant diagnostics from this hospitalization (including imaging, microbiology,  ancillary and laboratory) are listed below for reference.    Significant Diagnostic Studies: Mr [name] Contrast  02/10/2013   CLINICAL DATA:  78 year old female with witnessed episode of unresponsiveness, blanks staring. Bradycardia. Subsequently combative and agitated.  EXAM: MRI HEAD WITHOUT CONTRAST  TECHNIQUE: Multiplanar, multisequence MR imaging was performed. No intravenous contrast was administered.  COMPARISON:  Brain MRI 12/11/2007.  FINDINGS: Diffusion-weighted imaging is mildly no easy, degraded by motion artifact. No restricted diffusion or evidence of acute infarction.  Major intracranial vascular flow voids are stable.  No midline shift, mass effect, evidence of mass lesion, ventriculomegaly, extra-axial collection or acute intracranial hemorrhage. Cervicomedullary junction and pituitary are within normal limits.  Chronic sclerosis of the C2 vertebra and odontoid. Chronic ligamentous hypertrophy about the odontoid. Evidence of other upper cervical spine postoperative fusion. Increased sclerosis at the tip of the clivus (basion). No definite cervicomedullary junction stenosis.  Mildly generalized decreased cerebral volume since 2009. Insert stable signal no cortical  encephalomalacia. Wallace Cullens and white matter signal in the deep gray matter nuclei, brainstem and cerebellum remains normal for age.  Visualized orbit soft tissues are within normal limits. Stable paranasal sinuses and mastoids. Visualized scalp soft tissues are within normal limits.  IMPRESSION: 1. No acute intracranial abnormality. Stable brain since 2009 except for mild generalized volume loss.  2. Chronic sclerosis of the odontoid and C2 with ligamentous hypertrophy. Associated increased sclerosis at the tip of the clivus. These appear to be degenerative changes.   Electronically Signed   By: Augusto Gamble   On: 02/10/2013 11:58    Microbiology: No results found for this or any previous visit (from the past 240 hour(s)).    Labs: Basic Metabolic Panel:  Recent Labs Lab 02/08/13 1946 02/09/13 0500 02/10/13 0355  NA 142 143 140  K 3.8 3.5 3.4*  CL 104 106 106  CO2 27 26 24   GLUCOSE 105* 84 95  BUN 16 16 11   CREATININE 1.09 1.03 0.84  CALCIUM 9.4 9.2 9.4   Liver Function Tests:  Recent Labs Lab 02/08/13 1946  AST 25  ALT 14  ALKPHOS 66  BILITOT 0.2*  PROT 6.7  ALBUMIN 3.5   No results found for this basename: LIPASE, AMYLASE,  in the last 168 hours No results found for this basename: AMMONIA,  in the last 168 hours CBC:  Recent Labs Lab 02/08/13 1946 02/09/13 0500 02/10/13 0355  WBC 6.1 5.9 5.8  NEUTROABS 4.3  --   --   HGB 11.3* 11.3* 11.9*  HCT 34.1* 33.7* 35.8*  MCV 88.8 88.0 88.4  PLT 131* 132* 148*   Cardiac Enzymes:  Recent Labs Lab 02/08/13 2155 02/09/13 0500 02/09/13 0855  TROPONINI <0.30 <0.30 <0.30   BNP: BNP (last 3 results) No results found for this basename: PROBNP,  in the last 8760 hours CBG:  Recent Labs Lab 02/08/13 1912  GLUCAP 105*       Signed:  Delford Wingert,CHRISTOPHER  [facility] 02/10/2013, 12:33 PM

## 2013-02-10 NOTE — Progress Notes (Signed)
Discharge Note: Pt is alert and oriented, VS are stable, denies CP.  Telemetry & IV discontinued.  Discharge instructions reviewed with patient, pt verbalizes understanding.  Called Social Work to arrange details for EMS to transport patient. All belongings with patient

## 2013-02-10 NOTE — Progress Notes (Signed)
Clinical Child psychotherapist (CSW) received call from [employer] about arranging transport back to ALF [facility]). CSW completed D/C packet and contacted ALF who accepted patient back today. CSW contacted daughter Vonda Antigua [phone] to confirm patient's ALF and inform her about patient's D/C today. CSW arranged non-emergent EMS transport for patient back to ALF. CSW signing off.   Jetta Lout, LCSWA Weekend CSW [phone]

## 2013-02-15 ENCOUNTER — Emergency Department (HOSPITAL_COMMUNITY): Payer: Medicare Other

## 2013-02-15 ENCOUNTER — Encounter (HOSPITAL_COMMUNITY): Payer: Self-pay | Admitting: *Deleted

## 2013-02-15 ENCOUNTER — Emergency Department (HOSPITAL_COMMUNITY)
Admission: EM | Admit: 2013-02-15 | Discharge: 2013-02-15 | Disposition: A | Discharge status: Skilled Nursing Facility | Payer: Medicare Other | Attending: Emergency Medicine | Admitting: Emergency Medicine

## 2013-02-15 DIAGNOSIS — S60221A Contusion of right hand, initial encounter: Secondary | ICD-10-CM

## 2013-02-15 DIAGNOSIS — S60229A Contusion of unspecified hand, initial encounter: Secondary | ICD-10-CM | POA: Insufficient documentation

## 2013-02-15 DIAGNOSIS — Z87442 Personal history of urinary calculi: Secondary | ICD-10-CM | POA: Insufficient documentation

## 2013-02-15 DIAGNOSIS — Z8739 Personal history of other diseases of the musculoskeletal system and connective tissue: Secondary | ICD-10-CM | POA: Insufficient documentation

## 2013-02-15 DIAGNOSIS — F0390 Unspecified dementia without behavioral disturbance: Secondary | ICD-10-CM | POA: Insufficient documentation

## 2013-02-15 DIAGNOSIS — X58XXXA Exposure to other specified factors, initial encounter: Secondary | ICD-10-CM | POA: Insufficient documentation

## 2013-02-15 DIAGNOSIS — I129 Hypertensive chronic kidney disease with stage 1 through stage 4 chronic kidney disease, or unspecified chronic kidney disease: Secondary | ICD-10-CM | POA: Insufficient documentation

## 2013-02-15 DIAGNOSIS — Y9210 Unspecified residential institution as the place of occurrence of the external cause: Secondary | ICD-10-CM | POA: Insufficient documentation

## 2013-02-15 DIAGNOSIS — N189 Chronic kidney disease, unspecified: Secondary | ICD-10-CM | POA: Insufficient documentation

## 2013-02-15 DIAGNOSIS — Y939 Activity, unspecified: Secondary | ICD-10-CM | POA: Insufficient documentation

## 2013-02-15 DIAGNOSIS — Z79899 Other long term (current) drug therapy: Secondary | ICD-10-CM | POA: Insufficient documentation

## 2013-02-15 NOTE — ED Notes (Signed)
Bed: ZO10 Expected date: 02/15/13 Expected time: 1:25 AM Means of arrival: Ambulance Comments: Fall, no injury

## 2013-02-15 NOTE — ED Provider Notes (Signed)
CSN: [id number]     Arrival date & time 02/15/13  0140 History   First MD Initiated Contact with Patient 02/15/13 [phone]     Chief Complaint  Patient presents with  . Fall   (Consider location/radiation/quality/duration/timing/severity/associated sxs/prior Treatment) HPI Comments: Per EMS report: pt from [facility], assisted living facility; Staff found pt sitting on the floor and do not know if pt fall. Staff reports she wasn't "acting normal."  Pt hx of dementia. EMS noted a bruise on her right hand but did not note any deformities.  Pt reports some pain on right hand upon palpation.  At facility, pt denied any neck or back pain and pt couldn't deny or endorse a fall. EMS VS: CBG: 106, BP: 146/90, HR: 80R, RR: 16  Per the patient's granddaughter, she is severely demented, and appears to be at her baseline mentation. Level V caveat applies secondary to dementia.  The history is provided by medical records, the nursing home, the EMS personnel and a relative. No language interpreter was used.    Past Medical History  Diagnosis Date  . Hallucinations   . Abnormality of gait   . Spondylosis, cervical, with myelopathy   . Dementia   . Hypertension   . Chronic kidney disease     kidney stones   Past Surgical History  Procedure Laterality Date  . Lumbar laminectomy    . Knee arthroscopy    . C-spine     Family History  Problem Relation Age of Onset  . Hypertension Mother   . Hypertension Father    History  Substance Use Topics  . Smoking status: Never Smoker   . Smokeless tobacco: Never Used  . Alcohol Use: No   OB History   Grav Para Term Preterm Abortions TAB SAB Ect Mult Living                 Review of Systems  Unable to perform ROS: Dementia    Allergies  Review of patient's allergies indicates no known allergies.  Home Medications   Current Outpatient Rx  Name  Route  Sig  Dispense  Refill  . acetaminophen (TYLENOL) 500 MG tablet   Oral   Take 500 mg by  mouth every 4 (four) hours as needed for pain.         Marland Kitchen amLODipine (NORVASC) 5 MG tablet   Oral   Take 1 tablet (5 mg total) by mouth daily.   30 tablet   0   . calcium-vitamin D (OSCAL WITH D) 500-200 MG-UNIT per tablet   Oral   Take 1 tablet by mouth daily with breakfast.         . guaiFENesin (ROBITUSSIN) 100 MG/5ML SOLN   Oral   Take 10 mLs by mouth every 4 (four) hours as needed (cough).         . lidocaine (LIDODERM) 5 %   Transdermal   Place 1 patch onto the skin daily.          Marland Kitchen loperamide (IMODIUM A-D) 2 MG tablet   Oral   Take 2 mg by mouth 4 (four) times daily as needed for diarrhea or loose stools.         . magnesium hydroxide (MILK OF MAGNESIA) 400 MG/5ML suspension   Oral   Take 30 mLs by mouth daily as needed for constipation.         . Multiple Vitamins-Minerals (MULTIVITAMIN WITH MINERALS) tablet   Oral   Take 1 tablet  by mouth daily.         . pravastatin (PRAVACHOL) 40 MG tablet   Oral   Take 40 mg by mouth daily.          . QUEtiapine (SEROQUEL) 50 MG tablet   Oral   Take 50 mg by mouth at bedtime.          BP 140/82  Pulse 70  Temp(Src) 98.7 F (37.1 C) (Oral)  SpO2 100% Physical Exam  Nursing note and vitals reviewed. Constitutional: She is oriented to person, place, and time. She appears well-developed and well-nourished.  HENT:  Head: Normocephalic and atraumatic.  Eyes: Conjunctivae and EOM are normal. Pupils are equal, round, and reactive to light.  Neck: Normal range of motion. Neck supple.  Cardiovascular: Normal rate and regular rhythm.  Exam reveals no gallop and no friction rub.   No murmur heard. Pulmonary/Chest: Effort normal and breath sounds normal. No respiratory distress. She has no wheezes. She has no rales. She exhibits no tenderness.  Abdominal: Soft. Bowel sounds are normal. She exhibits no distension and no mass. There is no tenderness. There is no rebound and no guarding.  Musculoskeletal: Normal  range of motion. She exhibits no edema and no tenderness.  Bruising to right hand, no other bony abnormalities are deformities  Neurological: She is alert and oriented to person, place, and time.  Skin: Skin is warm and dry.  Psychiatric: She has a normal mood and affect. Her behavior is normal. Judgment and thought content normal.    ED Course  Procedures (including critical care time) Results for orders placed during the hospital encounter of 02/08/13  GLUCOSE, CAPILLARY      Result Value Range   Glucose-Capillary 105 (*) 70 - 99 mg/dL  CBC WITH DIFFERENTIAL      Result Value Range   WBC 6.1  4.0 - 10.5 K/uL   RBC 3.84 (*) 3.87 - 5.11 MIL/uL   Hemoglobin 11.3 (*) 12.0 - 15.0 g/dL   HCT 78.4 (*) 69.6 - 29.5 %   MCV 88.8  78.0 - 100.0 fL   MCH 29.4  26.0 - 34.0 pg   MCHC 33.1  30.0 - 36.0 g/dL   RDW 28.4  13.2 - 44.0 %   Platelets 131 (*) 150 - 400 K/uL   Neutrophils Relative % 70  43 - 77 %   Neutro Abs 4.3  1.7 - 7.7 K/uL   Lymphocytes Relative 20  12 - 46 %   Lymphs Abs 1.2  0.7 - 4.0 K/uL   Monocytes Relative 8  3 - 12 %   Monocytes Absolute 0.5  0.1 - 1.0 K/uL   Eosinophils Relative 1  0 - 5 %   Eosinophils Absolute 0.1  0.0 - 0.7 K/uL   Basophils Relative 0  0 - 1 %   Basophils Absolute 0.0  0.0 - 0.1 K/uL  COMPREHENSIVE METABOLIC PANEL      Result Value Range   Sodium 142  135 - 145 mEq/L   Potassium 3.8  3.5 - 5.1 mEq/L   Chloride 104  96 - 112 mEq/L   CO2 27  19 - 32 mEq/L   Glucose, Bld 105 (*) 70 - 99 mg/dL   BUN 16  6 - 23 mg/dL   Creatinine, Ser 1.02  0.50 - 1.10 mg/dL   Calcium 9.4  8.4 - 72.5 mg/dL   Total Protein 6.7  6.0 - 8.3 g/dL   Albumin 3.5  3.5 - 5.2  g/dL   AST 25  0 - 37 U/L   ALT 14  0 - 35 U/L   Alkaline Phosphatase 66  39 - 117 U/L   Total Bilirubin 0.2 (*) 0.3 - 1.2 mg/dL   GFR calc non Af Amer 48 (*) >90 mL/min   GFR calc Af Amer 56 (*) >90 mL/min  BASIC METABOLIC PANEL      Result Value Range   Sodium 143  135 - 145 mEq/L    Potassium 3.5  3.5 - 5.1 mEq/L   Chloride 106  96 - 112 mEq/L   CO2 26  19 - 32 mEq/L   Glucose, Bld 84  70 - 99 mg/dL   BUN 16  6 - 23 mg/dL   Creatinine, Ser 4.09  0.50 - 1.10 mg/dL   Calcium 9.2  8.4 - 81.1 mg/dL   GFR calc non Af Amer 52 (*) >90 mL/min   GFR calc Af Amer 60 (*) >90 mL/min  CBC      Result Value Range   WBC 5.9  4.0 - 10.5 K/uL   RBC 3.83 (*) 3.87 - 5.11 MIL/uL   Hemoglobin 11.3 (*) 12.0 - 15.0 g/dL   HCT 91.4 (*) 78.2 - 95.6 %   MCV 88.0  78.0 - 100.0 fL   MCH 29.5  26.0 - 34.0 pg   MCHC 33.5  30.0 - 36.0 g/dL   RDW 21.3  08.6 - 57.8 %   Platelets 132 (*) 150 - 400 K/uL  TSH      Result Value Range   TSH 3.205  0.350 - 4.500 uIU/mL  TROPONIN I      Result Value Range   Troponin I <0.30  <0.30 ng/mL  TROPONIN I      Result Value Range   Troponin I <0.30  <0.30 ng/mL  TROPONIN I      Result Value Range   Troponin I <0.30  <0.30 ng/mL  TSH      Result Value Range   TSH 2.021  0.350 - 4.500 uIU/mL  CBC      Result Value Range   WBC 5.8  4.0 - 10.5 K/uL   RBC 4.05  3.87 - 5.11 MIL/uL   Hemoglobin 11.9 (*) 12.0 - 15.0 g/dL   HCT 46.9 (*) 62.9 - 52.8 %   MCV 88.4  78.0 - 100.0 fL   MCH 29.4  26.0 - 34.0 pg   MCHC 33.2  30.0 - 36.0 g/dL   RDW 41.3  24.4 - 01.0 %   Platelets 148 (*) 150 - 400 K/uL  BASIC METABOLIC PANEL      Result Value Range   Sodium 140  135 - 145 mEq/L   Potassium 3.4 (*) 3.5 - 5.1 mEq/L   Chloride 106  96 - 112 mEq/L   CO2 24  19 - 32 mEq/L   Glucose, Bld 95  70 - 99 mg/dL   BUN 11  6 - 23 mg/dL   Creatinine, Ser 2.72  0.50 - 1.10 mg/dL   Calcium 9.4  8.4 - 53.6 mg/dL   GFR calc non Af Amer 66 (*) >90 mL/min   GFR calc Af Amer 77 (*) >90 mL/min  POCT I-STAT TROPONIN I      Result Value Range   Troponin i, poc 0.00  0.00 - 0.08 ng/mL   Comment 3            Mr Brain Wo Contrast  02/10/2013   CLINICAL  DATA:  78 year old female with witnessed episode of unresponsiveness, blanks staring. Bradycardia. Subsequently combative  and agitated.  EXAM: MRI HEAD WITHOUT CONTRAST  TECHNIQUE: Multiplanar, multisequence MR imaging was performed. No intravenous contrast was administered.  COMPARISON:  Brain MRI 12/11/2007.  FINDINGS: Diffusion-weighted imaging is mildly no easy, degraded by motion artifact. No restricted diffusion or evidence of acute infarction.  Major intracranial vascular flow voids are stable.  No midline shift, mass effect, evidence of mass lesion, ventriculomegaly, extra-axial collection or acute intracranial hemorrhage. Cervicomedullary junction and pituitary are within normal limits.  Chronic sclerosis of the C2 vertebra and odontoid. Chronic ligamentous hypertrophy about the odontoid. Evidence of other upper cervical spine postoperative fusion. Increased sclerosis at the tip of the clivus (basion). No definite cervicomedullary junction stenosis.  Mildly generalized decreased cerebral volume since 2009. Insert stable signal no cortical encephalomalacia. Wallace Cullens and white matter signal in the deep gray matter nuclei, brainstem and cerebellum remains normal for age.  Visualized orbit soft tissues are within normal limits. Stable paranasal sinuses and mastoids. Visualized scalp soft tissues are within normal limits.  IMPRESSION: 1. No acute intracranial abnormality. Stable brain since 2009 except for mild generalized volume loss.  2. Chronic sclerosis of the odontoid and C2 with ligamentous hypertrophy. Associated increased sclerosis at the tip of the clivus. These appear to be degenerative changes.   Electronically Signed   By: Augusto Gamble   On: 02/10/2013 11:58   Dg Hand Complete Right  02/15/2013   *RADIOLOGY REPORT*  Clinical Data: Fall, bruising across metacarpal region  RIGHT HAND - COMPLETE 3+ VIEW  Comparison: None.  Findings: There is no acute fracture or dislocation.  Joint spaces are aligned.  Degenerative osteoarthrosis is seen at the first [facility] and first MCP joints.  Mild degenerative osteoarthrosis also present at  the third and fifth DIPs.  No soft tissue abnormality.  No radiopaque foreign body.  Diffuse osteopenia is present.  IMPRESSION: 1.  No acute fracture or dislocation. 2.  Degenerative osteoarthrosis as above. 3.  Osteopenia.   Original Report Authenticated By: Rise Mu, M.D.      MDM   1. Hand contusion, right, initial encounter     patient was found by assisted living Center staff for pain. Suspect the patient rolled out of bed. She has bruising to the right hand. Will order plain films. No other obvious injuries. Patient is mentating at baseline per the family.  Patient seen by and discussed with Dr. Fayrene Fearing, who agrees that she may go back to her SNF.     Roxy Horseman, PA-C 02/15/13 [phone]

## 2013-02-15 NOTE — ED Notes (Signed)
Per EMS report: pt from [facility], assisted living facility; Staff found pt sitting on the floor and do not know if pt fall. Staff reports she wasn't "acting normal."  Pt hx of dementia. EMS noted a bruise on her right hand but did not note any deformities.  Pt reports some pain on right hand upon palpation.  At facility, pt denied any neck or back pain and pt couldn't deny or endorse a fall. EMS VS: CBG: 106, BP: 146/90, HR: 80R, RR: 16

## 2013-02-18 NOTE — ED Provider Notes (Signed)
Medical screening examination/treatment/procedure(s) were performed by non-physician practitioner and as supervising physician I was immediately available for consultation/collaboration.   Deja Kaigler Joseph Ayvah Caroll, MD 02/18/13 0720 

## 2014-01-30 ENCOUNTER — Emergency Department (HOSPITAL_COMMUNITY)
Admission: EM | Admit: 2014-01-30 | Discharge: 2014-01-30 | Disposition: A | Discharge status: Home / Self Care | Payer: Medicare Other | Attending: Emergency Medicine | Admitting: Emergency Medicine

## 2014-01-30 ENCOUNTER — Encounter (HOSPITAL_COMMUNITY): Payer: Self-pay | Admitting: Emergency Medicine

## 2014-01-30 DIAGNOSIS — Y9289 Other specified places as the place of occurrence of the external cause: Secondary | ICD-10-CM | POA: Diagnosis not present

## 2014-01-30 DIAGNOSIS — S0990XA Unspecified injury of head, initial encounter: Secondary | ICD-10-CM | POA: Insufficient documentation

## 2014-01-30 DIAGNOSIS — R296 Repeated falls: Secondary | ICD-10-CM | POA: Insufficient documentation

## 2014-01-30 DIAGNOSIS — F0390 Unspecified dementia without behavioral disturbance: Secondary | ICD-10-CM | POA: Diagnosis not present

## 2014-01-30 DIAGNOSIS — Z79899 Other long term (current) drug therapy: Secondary | ICD-10-CM | POA: Diagnosis not present

## 2014-01-30 DIAGNOSIS — N189 Chronic kidney disease, unspecified: Secondary | ICD-10-CM | POA: Diagnosis not present

## 2014-01-30 DIAGNOSIS — I129 Hypertensive chronic kidney disease with stage 1 through stage 4 chronic kidney disease, or unspecified chronic kidney disease: Secondary | ICD-10-CM | POA: Insufficient documentation

## 2014-01-30 DIAGNOSIS — IMO0002 Reserved for concepts with insufficient information to code with codable children: Secondary | ICD-10-CM | POA: Diagnosis not present

## 2014-01-30 DIAGNOSIS — Z8739 Personal history of other diseases of the musculoskeletal system and connective tissue: Secondary | ICD-10-CM | POA: Diagnosis not present

## 2014-01-30 DIAGNOSIS — Y9389 Activity, other specified: Secondary | ICD-10-CM | POA: Diagnosis not present

## 2014-01-30 LAB — CBG MONITORING, ED: Glucose-Capillary: 163 mg/dL — ABNORMAL HIGH (ref 70–99)

## 2014-01-30 NOTE — ED Provider Notes (Signed)
CSN: [id number]     Arrival date & time 01/30/14  1848 History   First MD Initiated Contact with Patient 01/30/14 1855     No chief complaint on file.  Level V dementia history is obtained from patient's daughter who accompanies her and from EMS  (Consider location/radiation/quality/duration/timing/severity/associated sxs/prior Treatment) HPI Patient was found by her daughter today with slight amount of swelling and abrasion over right eyebrow. She questioned nursing home staff did not know of a fall or any trauma fall. EMS reports the patient was ambulatory upon arrival to the skilled nursing facility. Past Medical History  Diagnosis Date  . Hallucinations   . Abnormality of gait   . Spondylosis, cervical, with myelopathy   . Dementia   . Hypertension   . Chronic kidney disease     kidney stones   history of present illness continued patient is acting as her normal self presently her daughter. Past Surgical History  Procedure Laterality Date  . Lumbar laminectomy    . Knee arthroscopy    . C-spine     Family History  Problem Relation Age of Onset  . Hypertension Mother   . Hypertension Father    History  Substance Use Topics  . Smoking status: Never Smoker   . Smokeless tobacco: Never Used  . Alcohol Use: No   no tobacco former user of alcohol OB History   Grav Para Term Preterm Abortions TAB SAB Ect Mult Living                 Review of Systems  Unable to perform ROS: Dementia      Allergies  Review of patient's allergies indicates no known allergies.  Home Medications   Prior to Admission medications   Medication Sig Start Date End Date Taking? Authorizing Provider  acetaminophen (TYLENOL) 500 MG tablet Take 500 mg by mouth every 4 (four) hours as needed for pain.    Historical Provider, MD  amLODipine (NORVASC) 5 MG tablet Take 1 tablet (5 mg total) by mouth daily. 02/10/13   Laveda Norman, MD  calcium-vitamin D (OSCAL WITH D) 500-200 MG-UNIT per tablet Take 1  tablet by mouth daily with breakfast.    Historical Provider, MD  guaiFENesin (ROBITUSSIN) 100 MG/5ML SOLN Take 10 mLs by mouth every 4 (four) hours as needed (cough).    Historical Provider, MD  lidocaine (LIDODERM) 5 % Place 1 patch onto the skin daily.  09/24/12   Historical Provider, MD  loperamide (IMODIUM A-D) 2 MG tablet Take 2 mg by mouth 4 (four) times daily as needed for diarrhea or loose stools.    Historical Provider, MD  magnesium hydroxide (MILK OF MAGNESIA) 400 MG/5ML suspension Take 30 mLs by mouth daily as needed for constipation.    Historical Provider, MD  Multiple Vitamins-Minerals (MULTIVITAMIN WITH MINERALS) tablet Take 1 tablet by mouth daily.    Historical Provider, MD  pravastatin (PRAVACHOL) 40 MG tablet Take 40 mg by mouth daily.  10/18/12   Historical Provider, MD  QUEtiapine (SEROQUEL) 50 MG tablet Take 50 mg by mouth at bedtime.    Historical Provider, MD   There were no vitals taken for this visit. Physical Exam  Nursing note and vitals reviewed. Constitutional: She appears well-developed and well-nourished.  Combative argumentative and hit staff  HENT:  Head: Normocephalic and atraumatic.  Right Ear: External ear normal.  Left Ear: External ear normal.  2 mm abrasion with minimal soft tissue swelling at right eyebrow otherwise atraumatic bilateral  tympanic membranes normal  Eyes: Conjunctivae are normal. Pupils are equal, round, and reactive to light.  Neck: Neck supple. No tracheal deviation present. No thyromegaly present.  Cardiovascular: Normal rate and regular rhythm.   Pulmonary/Chest: Effort normal and breath sounds normal.  Abdominal: Soft. Bowel sounds are normal. She exhibits no distension. There is no tenderness.  Musculoskeletal: Normal range of motion. She exhibits no edema and no tenderness.  [employer] spine nontender pelvis stable nontender all 4 extremities without deformity or tenderness neurovascularly intact  Neurological: She is alert.  Coordination normal.  Follow some simple commands moves all extremities motor strength 5 over 5 overall  Skin: Skin is warm and dry. No rash noted.  Psychiatric: She has a normal mood and affect.    ED Course  Procedures (including critical care time) Labs Review Labs Reviewed - No data to display  Imaging Review No results found.   EKG Interpretation None     Daughter reports patient is current on tetanus immunization. Results for orders placed during the hospital encounter of 01/30/14  CBG MONITORING, ED      Result Value Ref Range   Glucose-Capillary 163 (*) 70 - 99 mg/dL   No results found.  MDM  Imaging is not indicated discussed with daughter she is in agreement. Diagnosis#1 minor closed head injury #2hyperglycemia Final diagnoses:  None        Doug SouSam Lindsay Straka, MD 01/30/14 [id number]

## 2014-01-30 NOTE — Discharge Instructions (Signed)
Have Ms. [name] turn for behavioral changes or if concern for any reason Concussion A concussion, or closed-head injury, is a brain injury caused by a direct blow to the head or by a quick and sudden movement (jolt) of the head or neck. Concussions are usually not life-threatening. Even so, the effects of a concussion can be serious. If you have had a concussion before, you are more likely to experience concussion-like symptoms after a direct blow to the head.  CAUSES  Direct blow to the head, such as from running into another player during a soccer game, being hit in a fight, or hitting your head on a hard surface.  A jolt of the head or neck that causes the brain to move back and forth inside the skull, such as in a car crash. SIGNS AND SYMPTOMS The signs of a concussion can be hard to notice. Early on, they may be missed by you, family members, and health care providers. You may look fine but act or feel differently. Symptoms are usually temporary, but they may last for days, weeks, or even longer. Some symptoms may appear right away while others may not show up for hours or days. Every head injury is different. Symptoms include:  Mild to moderate headaches that will not go away.  A feeling of pressure inside your head.  Having more trouble than usual:  Learning or remembering things you have heard.  Answering questions.  Paying attention or concentrating.  Organizing daily tasks.  Making decisions and solving problems.  Slowness in thinking, acting or reacting, speaking, or reading.  Getting lost or being easily confused.  Feeling tired all the time or lacking energy (fatigued).  Feeling drowsy.  Sleep disturbances.  Sleeping more than usual.  Sleeping less than usual.  Trouble falling asleep.  Trouble sleeping (insomnia).  Loss of balance or feeling lightheaded or dizzy.  Nausea or vomiting.  Numbness or tingling.  Increased sensitivity  to:  Sounds.  Lights.  Distractions.  Vision problems or eyes that tire easily.  Diminished sense of taste or smell.  Ringing in the ears.  Mood changes such as feeling sad or anxious.  Becoming easily irritated or angry for little or no reason.  Lack of motivation.  Seeing or hearing things other people do not see or hear (hallucinations). DIAGNOSIS Your health care provider can usually diagnose a concussion based on a description of your injury and symptoms. He or she will ask whether you passed out (lost consciousness) and whether you are having trouble remembering events that happened right before and during your injury. Your evaluation might include:  A brain scan to look for signs of injury to the brain. Even if the test shows no injury, you may still have a concussion.  Blood tests to be sure other problems are not present. TREATMENT  Concussions are usually treated in an emergency department, in urgent care, or at a clinic. You may need to stay in the hospital overnight for further treatment.  Tell your health care provider if you are taking any medicines, including prescription medicines, over-the-counter medicines, and natural remedies. Some medicines, such as blood thinners (anticoagulants) and aspirin, may increase the chance of complications. Also tell your health care provider whether you have had alcohol or are taking illegal drugs. This information may affect treatment.  Your health care provider will send you home with important instructions to follow.  How fast you will recover from a concussion depends on many factors. These factors include  how severe your concussion is, what part of your brain was injured, your age, and how healthy you were before the concussion.  Most people with mild injuries recover fully. Recovery can take time. In general, recovery is slower in older persons. Also, persons who have had a concussion in the past or have other medical  problems may find that it takes longer to recover from their current injury. HOME CARE INSTRUCTIONS General Instructions  Carefully follow the directions your health care provider gave you.  Only take over-the-counter or prescription medicines for pain, discomfort, or fever as directed by your health care provider.  Take only those medicines that your health care provider has approved.  Do not drink alcohol until your health care provider says you are well enough to do so. Alcohol and certain other drugs may slow your recovery and can put you at risk of further injury.  If it is harder than usual to remember things, write them down.  If you are easily distracted, try to do one thing at a time. For example, do not try to watch TV while fixing dinner.  Talk with family members or close friends when making important decisions.  Keep all follow-up appointments. Repeated evaluation of your symptoms is recommended for your recovery.  Watch your symptoms and tell others to do the same. Complications sometimes occur after a concussion. Older adults with a brain injury may have a higher risk of serious complications, such as a blood clot on the brain.  Tell your teachers, school nurse, school counselor, coach, athletic trainer, or work Freight forwarder about your injury, symptoms, and restrictions. Tell them about what you can or cannot do. They should watch for:  Increased problems with attention or concentration.  Increased difficulty remembering or learning new information.  Increased time needed to complete tasks or assignments.  Increased irritability or decreased ability to cope with stress.  Increased symptoms.  Rest. Rest helps the brain to heal. Make sure you:  Get plenty of sleep at night. Avoid staying up late at night.  Keep the same bedtime hours on weekends and weekdays.  Rest during the day. Take daytime naps or rest breaks when you feel tired.  Limit activities that require a  lot of thought or concentration. These include:  Doing homework or job-related work.  Watching TV.  Working on the computer.  Avoid any situation where there is potential for another head injury (football, hockey, soccer, basketball, martial arts, downhill snow sports and horseback riding). Your condition will get worse every time you experience a concussion. You should avoid these activities until you are evaluated by the appropriate follow-up health care providers. Returning To Your Regular Activities You will need to return to your normal activities slowly, not all at once. You must give your body and brain enough time for recovery.  Do not return to sports or other athletic activities until your health care provider tells you it is safe to do so.  Ask your health care provider when you can drive, ride a bicycle, or operate heavy machinery. Your ability to react may be slower after a brain injury. Never do these activities if you are dizzy.  Ask your health care provider about when you can return to work or school. Preventing Another Concussion It is very important to avoid another brain injury, especially before you have recovered. In rare cases, another injury can lead to permanent brain damage, brain swelling, or death. The risk of this is greatest during the first  7-10 days after a head injury. Avoid injuries by:  Wearing a seat belt when riding in a car.  Drinking alcohol only in moderation.  Wearing a helmet when biking, skiing, skateboarding, skating, or doing similar activities.  Avoiding activities that could lead to a second concussion, such as contact or recreational sports, until your health care provider says it is okay.  Taking safety measures in your home.  Remove clutter and tripping hazards from floors and stairways.  Use grab bars in bathrooms and handrails by stairs.  Place non-slip mats on floors and in bathtubs.  Improve lighting in dim areas. SEEK MEDICAL  CARE IF:  You have increased problems paying attention or concentrating.  You have increased difficulty remembering or learning new information.  You need more time to complete tasks or assignments than before.  You have increased irritability or decreased ability to cope with stress.  You have more symptoms than before. Seek medical care if you have any of the following symptoms for more than 2 weeks after your injury:  Lasting (chronic) headaches.  Dizziness or balance problems.  Nausea.  Vision problems.  Increased sensitivity to noise or light.  Depression or mood swings.  Anxiety or irritability.  Memory problems.  Difficulty concentrating or paying attention.  Sleep problems.  Feeling tired all the time. SEEK IMMEDIATE MEDICAL CARE IF:  You have severe or worsening headaches. These may be a sign of a blood clot in the brain.  You have weakness (even if only in one hand, leg, or part of the face).  You have numbness.  You have decreased coordination.  You vomit repeatedly.  You have increased sleepiness.  One pupil is larger than the other.  You have convulsions.  You have slurred speech.  You have increased confusion. This may be a sign of a blood clot in the brain.  You have increased restlessness, agitation, or irritability.  You are unable to recognize people or places.  You have neck pain.  It is difficult to wake you up.  You have unusual behavior changes.  You lose consciousness. MAKE SURE YOU:  Understand these instructions.  Will watch your condition.  Will get help right away if you are not doing well or get worse. Document Released: 08/20/2003 Document Revised: 06/04/2013 Document Reviewed: 12/20/2012 [facility] Patient Information 2015 [place], [place]. This information is not intended to replace advice given to you by your health care provider. Make sure you discuss any questions you have with your health care provider.

## 2014-01-30 NOTE — ED Notes (Signed)
Pt here from [facility] , here after a  fall , pt has a small knot above the right eye

## 2014-05-13 ENCOUNTER — Encounter (HOSPITAL_COMMUNITY): Payer: Self-pay

## 2014-05-13 ENCOUNTER — Emergency Department (HOSPITAL_COMMUNITY)
Admission: EM | Admit: 2014-05-13 | Discharge: 2014-05-13 | Disposition: A | Discharge status: Home / Self Care | Payer: Medicare Other | Attending: Emergency Medicine | Admitting: Emergency Medicine

## 2014-05-13 DIAGNOSIS — Y998 Other external cause status: Secondary | ICD-10-CM | POA: Insufficient documentation

## 2014-05-13 DIAGNOSIS — Y9389 Activity, other specified: Secondary | ICD-10-CM | POA: Insufficient documentation

## 2014-05-13 DIAGNOSIS — X58XXXA Exposure to other specified factors, initial encounter: Secondary | ICD-10-CM | POA: Insufficient documentation

## 2014-05-13 DIAGNOSIS — F0390 Unspecified dementia without behavioral disturbance: Secondary | ICD-10-CM | POA: Insufficient documentation

## 2014-05-13 DIAGNOSIS — I129 Hypertensive chronic kidney disease with stage 1 through stage 4 chronic kidney disease, or unspecified chronic kidney disease: Secondary | ICD-10-CM | POA: Insufficient documentation

## 2014-05-13 DIAGNOSIS — Z792 Long term (current) use of antibiotics: Secondary | ICD-10-CM | POA: Insufficient documentation

## 2014-05-13 DIAGNOSIS — Z79899 Other long term (current) drug therapy: Secondary | ICD-10-CM | POA: Diagnosis not present

## 2014-05-13 DIAGNOSIS — Z8739 Personal history of other diseases of the musculoskeletal system and connective tissue: Secondary | ICD-10-CM | POA: Diagnosis not present

## 2014-05-13 DIAGNOSIS — Y92129 Unspecified place in nursing home as the place of occurrence of the external cause: Secondary | ICD-10-CM | POA: Insufficient documentation

## 2014-05-13 DIAGNOSIS — Z043 Encounter for examination and observation following other accident: Secondary | ICD-10-CM | POA: Insufficient documentation

## 2014-05-13 DIAGNOSIS — N189 Chronic kidney disease, unspecified: Secondary | ICD-10-CM | POA: Insufficient documentation

## 2014-05-13 DIAGNOSIS — W19XXXA Unspecified fall, initial encounter: Secondary | ICD-10-CM

## 2014-05-13 LAB — URINALYSIS, ROUTINE W REFLEX MICROSCOPIC
Bilirubin Urine: NEGATIVE
GLUCOSE, UA: NEGATIVE mg/dL
Ketones, ur: NEGATIVE mg/dL
Nitrite: NEGATIVE
PH: 6 (ref 5.0–8.0)
Protein, ur: NEGATIVE mg/dL
Specific Gravity, Urine: 1.016 (ref 1.005–1.030)
Urobilinogen, UA: 0.2 mg/dL (ref 0.0–1.0)

## 2014-05-13 LAB — URINE MICROSCOPIC-ADD ON

## 2014-05-13 NOTE — ED Provider Notes (Signed)
CSN: [id number]     Arrival date & time 05/13/14  0307 History  This chart was scribed for Loren Raceravid Jazmin Vensel, MD by Murriel HopperAlec Bankhead, ED Scribe. This patient was seen in room D33C/D33C and the patient's care was started at 3:14 AM.    Chief Complaint  Patient presents with  . Fall      HPI  HPI Comments: [name] is a 79 y.o. female brought in by EMS [facility] after being found on the ground. The patient has advanced dementia and is unable to provide any history. Level V caveat applies. Patient was found on the ground by staff. She is at her mental baseline which is only oriented to self. She is able to ambulate without difficulty.   Past Medical History  Diagnosis Date  . Hallucinations   . Abnormality of gait   . Spondylosis, cervical, with myelopathy   . Dementia   . Hypertension   . Chronic kidney disease     kidney stones   Past Surgical History  Procedure Laterality Date  . Lumbar laminectomy    . Knee arthroscopy    . C-spine     Family History  Problem Relation Age of Onset  . Hypertension Mother   . Hypertension Father    History  Substance Use Topics  . Smoking status: Never Smoker   . Smokeless tobacco: Never Used  . Alcohol Use: No   OB History    No data available     Review of Systems  Unable to perform ROS: Dementia      Allergies  Review of patient's allergies indicates no known allergies.  Home Medications   Prior to Admission medications   Medication Sig Start Date End Date Taking? Authorizing Provider  acetaminophen (TYLENOL) 500 MG tablet Take 500 mg by mouth every 4 (four) hours as needed for pain.   Yes Historical Provider, MD  ALPRAZolam (XANAX) 0.25 MG tablet Take 0.25 mg by mouth 2 (two) times daily.   Yes Historical Provider, MD  ALPRAZolam (XANAX) 0.25 MG tablet Take 0.25 mg by mouth every 6 (six) hours as needed for anxiety.   Yes Historical Provider, MD  Alum & Mag Hydroxide-Simeth (GERI-LANTA PO) Take 30 mLs by  mouth 4 (four) times daily as needed (heartburn/indigestion).   Yes Historical Provider, MD  bumetanide (BUMEX) 0.5 MG tablet Take 0.5 mg by mouth daily.   Yes Historical Provider, MD  divalproex (DEPAKOTE) 125 MG DR tablet Take 125-250 mg by mouth 2 (two) times daily. Take 250mg  in the morning and 125mg  in the evening   Yes Historical Provider, MD  guaiFENesin (ROBITUSSIN) 100 MG/5ML SOLN Take 10 mLs by mouth every 6 (six) hours as needed for cough.    Yes Historical Provider, MD  haloperidol (HALDOL) 0.5 MG tablet Take 0.5 mg by mouth every 4 (four) hours as needed for agitation.   Yes Historical Provider, MD  HYDROcodone-acetaminophen (NORCO/VICODIN) 5-325 MG per tablet Take 1 tablet by mouth every 12 (twelve) hours as needed for moderate pain.   Yes Historical Provider, MD  loperamide (IMODIUM) 2 MG capsule Take 2 mg by mouth every 3 (three) hours as needed for diarrhea or loose stools.   Yes Historical Provider, MD  magnesium hydroxide (MILK OF MAGNESIA) 400 MG/5ML suspension Take 30 mLs by mouth at bedtime as needed for mild constipation.    Yes Historical Provider, MD  Neomycin-Bacitracin-Polymyxin (TRIPLE ANTIBIOTIC EX) Apply 1 application topically daily as needed (wound care).  Yes Historical Provider, MD  pravastatin (PRAVACHOL) 40 MG tablet Take 40 mg by mouth daily.  10/18/12  Yes Historical Provider, MD  risperiDONE (RISPERDAL) 1 MG/ML oral solution Take 0.5 mg by mouth 2 (two) times daily.   Yes Historical Provider, MD  rivastigmine (EXELON) 1.5 MG capsule Take 1.5 mg by mouth 2 (two) times daily.   Yes Historical Provider, MD  traMADol (ULTRAM) 50 MG tablet Take 50 mg by mouth every 4 (four) hours as needed for moderate pain.   Yes Historical Provider, MD  Vitamin D, Ergocalciferol, (DRISDOL) 50000 UNITS CAPS capsule Take 50,000 Units by mouth every 7 (seven) days. ON THURSDAYS   Yes Historical Provider, MD  amLODipine (NORVASC) 5 MG tablet Take 5 mg by mouth daily.    Historical  Provider, MD  calcium-vitamin D (OSCAL WITH D) 500-200 MG-UNIT per tablet Take 1 tablet by mouth daily with breakfast.    Historical Provider, MD  lidocaine (LIDODERM) 5 % Place 1 patch onto the skin daily.  09/24/12   Historical Provider, MD  loperamide (IMODIUM A-D) 2 MG tablet Take 2 mg by mouth 4 (four) times daily as needed for diarrhea or loose stools.    Historical Provider, MD  Multiple Vitamins-Minerals (MULTIVITAMIN WITH MINERALS) tablet Take 1 tablet by mouth daily.    Historical Provider, MD  QUEtiapine (SEROQUEL) 50 MG tablet Take 50 mg by mouth at bedtime.    Historical Provider, MD   BP 175/67 mmHg  Pulse 63  Temp(Src)   Resp 22  SpO2 100% Physical Exam  Constitutional: She appears well-developed and well-nourished. No distress.  HENT:  Head: Normocephalic and atraumatic.  Mouth/Throat: Oropharynx is clear and moist.  No trauma to the head or neck.  Eyes: EOM are normal. Pupils are equal, round, and reactive to light.  Neck: Normal range of motion. Neck supple.  No posterior midline cervical tenderness with palpation.  Cardiovascular: Normal rate and regular rhythm.   Pulmonary/Chest: Effort normal and breath sounds normal. No respiratory distress. She has no wheezes. She has no rales.  Abdominal: Soft. Bowel sounds are normal. She exhibits no distension and no mass. There is no tenderness. There is no rebound and no guarding.  Musculoskeletal: Normal range of motion. She exhibits no edema or tenderness.  No thoracic or lumbar tenderness with palpation. Pelvis is stable. Normal range of motion of all joints. Patient is ambulatory. Distal pulses intact.  Neurological: She is alert.  Patient oriented only to self. Moves all extremities without any focal weakness or numbness.  Skin: Skin is warm and dry. No rash noted. No erythema.  Psychiatric: She has a normal mood and affect. Her behavior is normal.  Nursing note and vitals reviewed.   ED Course  Procedures (including  critical care time)  DIAGNOSTIC STUDIES: Oxygen Saturation is 100% on room air, normal by my interpretation.    COORDINATION OF CARE: 5:37 AM Discussed treatment plan with pt at bedside and pt agreed to plan.   Labs Review Labs Reviewed  URINALYSIS, ROUTINE W REFLEX MICROSCOPIC - Abnormal; Notable for the following:    APPearance CLOUDY (*)    Hgb urine dipstick MODERATE (*)    Leukocytes, UA TRACE (*)    All other components within normal limits  URINE MICROSCOPIC-ADD ON    Imaging Review No results found.   EKG Interpretation None      MDM   Final diagnoses:  Fall, initial encounter   I personally performed the services described in this documentation,  which was scribed in my presence. The recorded information has been reviewed and is accurate.  Will discharge back to SNF. I do not believe any imaging is necessary at this point. Return precautions given.    Loren Raceravid Akshaya Toepfer, MD 05/13/14 [phone]

## 2014-05-13 NOTE — Discharge Instructions (Signed)
Fall Prevention and Home Safety °Falls cause injuries and can affect all age groups. It is possible to prevent falls.  °HOW TO PREVENT FALLS °· Wear shoes with rubber soles that do not have an opening for your toes. °· Keep the inside and outside of your house well lit. °· Use night lights throughout your home. °· Remove clutter from floors. °· Clean up floor spills. °· Remove throw rugs or fasten them to the floor with carpet tape. °· Do not place electrical cords across pathways. °· Put grab bars by your tub, shower, and toilet. Do not use towel bars as grab bars. °· Put handrails on both sides of the stairway. Fix loose handrails. °· Do not climb on stools or stepladders, if possible. °· Do not wax your floors. °· Repair uneven or unsafe sidewalks, walkways, or stairs. °· Keep items you use a lot within reach. °· Be aware of pets. °· Keep emergency numbers next to the telephone. °· Put smoke detectors in your home and near bedrooms. °Ask your doctor what other things you can do to prevent falls. °Document Released: 03/26/2009 Document Revised: 11/29/2011 Document Reviewed: 08/30/2011 °[employer]® Patient Information ©2015 [employer]. This information is not intended to replace advice given to you by your health care provider. Make sure you discuss any questions you have with your health care provider. ° °

## 2014-05-13 NOTE — ED Notes (Signed)
Per EMS: [facility]. Pt found on ground, unwitnessed fall. Pt oriented to self only.

## 2014-06-14 ENCOUNTER — Emergency Department (HOSPITAL_COMMUNITY)
Admission: EM | Admit: 2014-06-14 | Discharge: 2014-06-14 | Disposition: A | Discharge status: Home / Self Care | Payer: Medicare Other | Attending: Emergency Medicine | Admitting: Emergency Medicine

## 2014-06-14 ENCOUNTER — Encounter (HOSPITAL_COMMUNITY): Payer: Self-pay | Admitting: Emergency Medicine

## 2014-06-14 ENCOUNTER — Emergency Department (HOSPITAL_COMMUNITY): Payer: Medicare Other

## 2014-06-14 DIAGNOSIS — Z8739 Personal history of other diseases of the musculoskeletal system and connective tissue: Secondary | ICD-10-CM | POA: Insufficient documentation

## 2014-06-14 DIAGNOSIS — W1839XA Other fall on same level, initial encounter: Secondary | ICD-10-CM | POA: Insufficient documentation

## 2014-06-14 DIAGNOSIS — Y998 Other external cause status: Secondary | ICD-10-CM | POA: Insufficient documentation

## 2014-06-14 DIAGNOSIS — S8991XA Unspecified injury of right lower leg, initial encounter: Secondary | ICD-10-CM | POA: Diagnosis not present

## 2014-06-14 DIAGNOSIS — S79912A Unspecified injury of left hip, initial encounter: Secondary | ICD-10-CM | POA: Insufficient documentation

## 2014-06-14 DIAGNOSIS — N189 Chronic kidney disease, unspecified: Secondary | ICD-10-CM | POA: Insufficient documentation

## 2014-06-14 DIAGNOSIS — S8992XA Unspecified injury of left lower leg, initial encounter: Secondary | ICD-10-CM | POA: Diagnosis not present

## 2014-06-14 DIAGNOSIS — S79911A Unspecified injury of right hip, initial encounter: Secondary | ICD-10-CM | POA: Insufficient documentation

## 2014-06-14 DIAGNOSIS — W19XXXA Unspecified fall, initial encounter: Secondary | ICD-10-CM

## 2014-06-14 DIAGNOSIS — I129 Hypertensive chronic kidney disease with stage 1 through stage 4 chronic kidney disease, or unspecified chronic kidney disease: Secondary | ICD-10-CM | POA: Diagnosis not present

## 2014-06-14 DIAGNOSIS — F0390 Unspecified dementia without behavioral disturbance: Secondary | ICD-10-CM | POA: Insufficient documentation

## 2014-06-14 DIAGNOSIS — S99911A Unspecified injury of right ankle, initial encounter: Secondary | ICD-10-CM | POA: Insufficient documentation

## 2014-06-14 DIAGNOSIS — R52 Pain, unspecified: Secondary | ICD-10-CM

## 2014-06-14 DIAGNOSIS — Z79899 Other long term (current) drug therapy: Secondary | ICD-10-CM | POA: Diagnosis not present

## 2014-06-14 DIAGNOSIS — Y9289 Other specified places as the place of occurrence of the external cause: Secondary | ICD-10-CM | POA: Diagnosis not present

## 2014-06-14 DIAGNOSIS — S99912A Unspecified injury of left ankle, initial encounter: Secondary | ICD-10-CM | POA: Diagnosis not present

## 2014-06-14 DIAGNOSIS — Y9389 Activity, other specified: Secondary | ICD-10-CM | POA: Insufficient documentation

## 2014-06-14 MED ORDER — LORAZEPAM 2 MG/ML IJ SOLN
1.0000 mg | Freq: Once | INTRAMUSCULAR | Status: AC
  Administered 2014-06-14: 1 mg via INTRAMUSCULAR
  Filled 2014-06-14: qty 1

## 2014-06-14 NOTE — ED Notes (Signed)
Informed by radiology that the pt was too agitated to do the x-ray.  Pt seen to be agitated in the hallway.  Family attempting to calm pt.  Dr. Patria Mane made aware.

## 2014-06-14 NOTE — ED Notes (Signed)
Bed: [facility] Expected date:  Expected time:  Means of arrival:  Comments: fall 

## 2014-06-14 NOTE — ED Notes (Signed)
Patient transported to X-ray 

## 2014-06-14 NOTE — ED Provider Notes (Signed)
CSN: [id number]     Arrival date & time 06/14/14  1758 History   First MD Initiated Contact with Patient 06/14/14 1812     Chief Complaint  Patient presents with  . Fall     Level V caveat: Dementia  HPI Patient had an unwitnessed fall at a nursing memory care unit today.  Initially if reported that the patient was complaining of pain in the left hip.  Patient denies pain anywhere at this time.  Family reports baseline mental status.  No obviously Thomas to the head.  She's noted to be moving all 4 extremities during my initial evaluation.  Family reports that she's been in her normal state health recently.   Past Medical History  Diagnosis Date  . Hallucinations   . Abnormality of gait   . Spondylosis, cervical, with myelopathy   . Dementia   . Hypertension   . Chronic kidney disease     kidney stones   Past Surgical History  Procedure Laterality Date  . Lumbar laminectomy    . Knee arthroscopy    . C-spine     Family History  Problem Relation Age of Onset  . Hypertension Mother   . Hypertension Father    History  Substance Use Topics  . Smoking status: Never Smoker   . Smokeless tobacco: Never Used  . Alcohol Use: No   OB History    No data available     Review of Systems  All other systems reviewed and are negative.     Allergies  Review of patient's allergies indicates no known allergies.  Home Medications   Prior to Admission medications   Medication Sig Start Date End Date Taking? Authorizing Provider  acetaminophen (TYLENOL) 500 MG tablet Take 500 mg by mouth every 4 (four) hours as needed for pain.   Yes Historical Provider, MD  ALPRAZolam (XANAX) 0.25 MG tablet Take 0.25 mg by mouth 2 (two) times daily.   Yes Historical Provider, MD  Alum & Mag Hydroxide-Simeth (GERI-LANTA PO) Take 30 mLs by mouth 4 (four) times daily as needed (heartburn/indigestion).   Yes Historical Provider, MD  bumetanide (BUMEX) 0.5 MG tablet Take 0.5 mg by mouth daily.   Yes  Historical Provider, MD  divalproex (DEPAKOTE) 125 MG DR tablet Take 125-250 mg by mouth 2 (two) times daily. Take  in the morning and  in the evening   Yes Historical Provider, MD  guaiFENesin (ROBITUSSIN) 100 MG/5ML SOLN Take 10 mLs by mouth every 6 (six) hours as needed for cough.    Yes Historical Provider, MD  haloperidol (HALDOL) 0.5 MG tablet Take 0.5 mg by mouth every 4 (four) hours as needed for agitation.   Yes Historical Provider, MD  HYDROcodone-acetaminophen (NORCO/VICODIN) 5-325 MG per tablet Take 1 tablet by mouth every 12 (twelve) hours as needed for moderate pain.   Yes Historical Provider, MD  loperamide (IMODIUM A-D) 2 MG tablet Take 2 mg by mouth 4 (four) times daily as needed for diarrhea or loose stools.   Yes Historical Provider, MD  magnesium hydroxide (MILK OF MAGNESIA) 400 MG/5ML suspension Take 30 mLs by mouth at bedtime as needed for mild constipation.    Yes Historical Provider, MD  Neomycin-Bacitracin-Polymyxin (TRIPLE ANTIBIOTIC EX) Apply 1 application topically daily as needed (wound care).   Yes Historical Provider, MD  pravastatin (PRAVACHOL) 40 MG tablet Take 40 mg by mouth daily.  10/18/12  Yes Historical Provider, MD  risperiDONE (RISPERDAL) 1 MG/ML oral solution Take 0.5 mg  by mouth 2 (two) times daily.   Yes Historical Provider, MD  rivastigmine (EXELON) 1.5 MG capsule Take 1.5 mg by mouth 2 (two) times daily.   Yes Historical Provider, MD  traMADol (ULTRAM) 50 MG tablet Take 50 mg by mouth every 4 (four) hours as needed for moderate pain.   Yes Historical Provider, MD  Vitamin D, Ergocalciferol, (DRISDOL) 50000 UNITS CAPS capsule Take 50,000 Units by mouth every 7 (seven) days. ON THURSDAYS   Yes Historical Provider, MD   BP 120/58 mmHg  Pulse 60  Temp(Src) 98.5 F (36.9 C) (Oral)  Resp 16  SpO2 100% Physical Exam  Constitutional: She appears well-developed and well-nourished. No distress.  HENT:  Head: Normocephalic and atraumatic.  Eyes: EOM  are normal.  Neck: Normal range of motion.  Cardiovascular: Normal rate, regular rhythm and normal heart sounds.   Pulmonary/Chest: Effort normal and breath sounds normal.  Abdominal: Soft. She exhibits no distension. There is no tenderness.  Musculoskeletal: Normal range of motion.  Full range of motion of bilateral hips, knees, ankles.  Neurological: She is alert.  Skin: Skin is warm and dry.  Psychiatric: She has a normal mood and affect. Judgment normal.  Nursing note and vitals reviewed.   ED Course  Procedures (including critical care time) Labs Review Labs Reviewed - No data to display  Imaging Review Dg Hip Complete Left  06/14/2014   CLINICAL DATA:  Status post fall; acute onset of left hip pain. Initial encounter.  EXAM: LEFT HIP - COMPLETE 2+ VIEW  COMPARISON:  None.  FINDINGS: There is no evidence of fracture or dislocation. Both femoral heads are seated normally within their respective acetabula. The proximal left femur appears intact. Mild degenerative change is noted at the lower lumbar spine. The sacroiliac joints are unremarkable in appearance.  The visualized bowel gas pattern is grossly unremarkable in appearance.  IMPRESSION: No evidence of fracture or dislocation.   Electronically Signed   By: Roanna Raider M.D.   On: 06/14/2014 20:33     EKG Interpretation None      MDM   Final diagnoses:  Fall  Pain    Vitals are normal.  Patient is well-appearing.  X-ray of the left hip is normal.  Discharge home in good condition.    Lyanne Co, MD 06/14/14 [phone]

## 2014-06-14 NOTE — ED Notes (Signed)
Pt from [facility] via EMS- Per EMS, pt fell and was c/o hip pain and L foot pain per facility. Upon EMS arrival, pt denies all pain. Pt able to move all extremities w/o difficulty. Pt has no obvious injuries but needs to be cleared per facility. Pt has hx of advanced dementia and in is not oriented, this is baseline for her. Pt in NAD.

## 2014-07-04 ENCOUNTER — Encounter (HOSPITAL_COMMUNITY): Payer: Self-pay | Admitting: *Deleted

## 2014-07-04 ENCOUNTER — Emergency Department (HOSPITAL_COMMUNITY)
Admission: EM | Admit: 2014-07-04 | Discharge: 2014-07-04 | Disposition: A | Discharge status: Home / Self Care | Payer: Medicare Other | Attending: Emergency Medicine | Admitting: Emergency Medicine

## 2014-07-04 ENCOUNTER — Emergency Department (HOSPITAL_COMMUNITY): Payer: Medicare Other

## 2014-07-04 DIAGNOSIS — N189 Chronic kidney disease, unspecified: Secondary | ICD-10-CM | POA: Insufficient documentation

## 2014-07-04 DIAGNOSIS — W1830XA Fall on same level, unspecified, initial encounter: Secondary | ICD-10-CM | POA: Diagnosis not present

## 2014-07-04 DIAGNOSIS — Y9289 Other specified places as the place of occurrence of the external cause: Secondary | ICD-10-CM | POA: Insufficient documentation

## 2014-07-04 DIAGNOSIS — Z043 Encounter for examination and observation following other accident: Secondary | ICD-10-CM | POA: Diagnosis present

## 2014-07-04 DIAGNOSIS — I129 Hypertensive chronic kidney disease with stage 1 through stage 4 chronic kidney disease, or unspecified chronic kidney disease: Secondary | ICD-10-CM | POA: Insufficient documentation

## 2014-07-04 DIAGNOSIS — Z79899 Other long term (current) drug therapy: Secondary | ICD-10-CM | POA: Diagnosis not present

## 2014-07-04 DIAGNOSIS — F0390 Unspecified dementia without behavioral disturbance: Secondary | ICD-10-CM | POA: Insufficient documentation

## 2014-07-04 DIAGNOSIS — Z87442 Personal history of urinary calculi: Secondary | ICD-10-CM | POA: Diagnosis not present

## 2014-07-04 DIAGNOSIS — Z8659 Personal history of other mental and behavioral disorders: Secondary | ICD-10-CM

## 2014-07-04 DIAGNOSIS — Y998 Other external cause status: Secondary | ICD-10-CM | POA: Insufficient documentation

## 2014-07-04 DIAGNOSIS — Y9389 Activity, other specified: Secondary | ICD-10-CM | POA: Insufficient documentation

## 2014-07-04 DIAGNOSIS — Z8739 Personal history of other diseases of the musculoskeletal system and connective tissue: Secondary | ICD-10-CM | POA: Insufficient documentation

## 2014-07-04 DIAGNOSIS — W19XXXA Unspecified fall, initial encounter: Secondary | ICD-10-CM

## 2014-07-04 NOTE — ED Provider Notes (Signed)
CSN: [id number]     Arrival date & time 07/04/14  1634 History   First MD Initiated Contact with Patient 07/04/14 1639     Chief Complaint  Patient presents with  . Fall     (Consider location/radiation/quality/duration/timing/severity/associated sxs/prior Treatment) HPI Comments: Patient is a 80 year old female with history of advanced dementia. She is currently a resident of a dementia care facility. She was brought here for evaluation after a witnessed fall. She was apparently walking with staff that she lost her balance and fell to the floor. She has been ambulatory since this time and denies to me that she is having any pain.  Patient is a 80 y.o. female presenting with fall. The history is provided by the patient, the EMS personnel and the nursing home.  Fall This is a new problem. The current episode started less than 1 hour ago. The problem occurs constantly. The problem has not changed since onset.Pertinent negatives include no chest pain, no abdominal pain, no headaches and no shortness of breath. Nothing aggravates the symptoms. Nothing relieves the symptoms. She has tried nothing for the symptoms. The treatment provided no relief.    Past Medical History  Diagnosis Date  . Hallucinations   . Abnormality of gait   . Spondylosis, cervical, with myelopathy   . Dementia   . Hypertension   . Chronic kidney disease     kidney stones   Past Surgical History  Procedure Laterality Date  . Lumbar laminectomy    . Knee arthroscopy    . C-spine     Family History  Problem Relation Age of Onset  . Hypertension Mother   . Hypertension Father    History  Substance Use Topics  . Smoking status: Never Smoker   . Smokeless tobacco: Never Used  . Alcohol Use: No   OB History    No data available     Review of Systems  Respiratory: Negative for shortness of breath.   Cardiovascular: Negative for chest pain.  Gastrointestinal: Negative for abdominal pain.  Neurological:  Negative for headaches.  All other systems reviewed and are negative.     Allergies  Review of patient's allergies indicates no known allergies.  Home Medications   Prior to Admission medications   Medication Sig Start Date End Date Taking? Authorizing Provider  acetaminophen (TYLENOL) 500 MG tablet Take 500 mg by mouth every 4 (four) hours as needed for pain.    Historical Provider, MD  ALPRAZolam Prudy Feeler) 0.25 MG tablet Take 0.25 mg by mouth 2 (two) times daily.    Historical Provider, MD  Alum & Mag Hydroxide-Simeth (GERI-LANTA PO) Take 30 mLs by mouth 4 (four) times daily as needed (heartburn/indigestion).    Historical Provider, MD  bumetanide (BUMEX) 0.5 MG tablet Take 0.5 mg by mouth daily.    Historical Provider, MD  divalproex (DEPAKOTE) 125 MG DR tablet Take 125-250 mg by mouth 2 (two) times daily. Take  in the morning and  in the evening    Historical Provider, MD  guaiFENesin (ROBITUSSIN) 100 MG/5ML SOLN Take 10 mLs by mouth every 6 (six) hours as needed for cough.     Historical Provider, MD  haloperidol (HALDOL) 0.5 MG tablet Take 0.5 mg by mouth every 4 (four) hours as needed for agitation.    Historical Provider, MD  loperamide (IMODIUM A-D) 2 MG tablet Take 2 mg by mouth 4 (four) times daily as needed for diarrhea or loose stools.    Historical Provider, MD  magnesium  hydroxide (MILK OF MAGNESIA) 400 MG/5ML suspension Take 30 mLs by mouth at bedtime as needed for mild constipation.     Historical Provider, MD  Neomycin-Bacitracin-Polymyxin (TRIPLE ANTIBIOTIC EX) Apply 1 application topically daily as needed (wound care).    Historical Provider, MD  pravastatin (PRAVACHOL) 40 MG tablet Take 40 mg by mouth daily.  10/18/12   Historical Provider, MD  risperiDONE (RISPERDAL) 1 MG/ML oral solution Take 0.5 mg by mouth 2 (two) times daily.    Historical Provider, MD  rivastigmine (EXELON) 1.5 MG capsule Take 1.5 mg by mouth 2 (two) times daily.    Historical Provider, MD   traMADol (ULTRAM) 50 MG tablet Take 50 mg by mouth every 4 (four) hours as needed for moderate pain.    Historical Provider, MD  Vitamin D, Ergocalciferol, (DRISDOL) 50000 UNITS CAPS capsule Take 50,000 Units by mouth every 7 (seven) days. ON THURSDAYS    Historical Provider, MD   BP 141/72 mmHg  Pulse 67  Temp(Src) 98 F (36.7 C) (Oral)  Resp 18  SpO2 100% Physical Exam  Constitutional: She is oriented to person, place, and time. She appears well-developed and well-nourished. No distress.  HENT:  Head: Normocephalic and atraumatic.  There is no evidence for any head trauma. There are no abrasions, swelling, bruises, etc.  Eyes: EOM are normal. Pupils are equal, round, and reactive to light.  Neck: Normal range of motion. Neck supple.  Cardiovascular: Normal rate and regular rhythm.  Exam reveals no gallop and no friction rub.   No murmur heard. Pulmonary/Chest: Effort normal and breath sounds normal. No respiratory distress. She has no wheezes.  Abdominal: Soft. Bowel sounds are normal. She exhibits no distension. There is no tenderness.  Musculoskeletal: Normal range of motion.  Neurological: She is alert and oriented to person, place, and time. No cranial nerve deficit. She exhibits normal muscle tone. Coordination normal.  Neurologic exam is somewhat limited due to dementia, however she moves all extremities, is ambulatory, and I appreciate no focality.  Skin: Skin is warm and dry. She is not diaphoretic.  Nursing note and vitals reviewed.   ED Course  Procedures (including critical care time) Labs Review Labs Reviewed - No data to display  Imaging Review No results found.   EKG Interpretation None      MDM   Final diagnoses:  None    Patient appears to be uninjured from this fall. She has been ambulated in the emergency department and does quite well. There is no evidence for any head injury and she is neurologically intact. I feel no indication for CT or other  imaging studies. She will be returned to the facility, to return as needed for any problems.    Geoffery Lyonsouglas Miamarie Moll, MD 07/04/14 [phone]

## 2014-07-04 NOTE — ED Notes (Signed)
Bed: WA03 Expected date:  Expected time:  Means of arrival:  Comments: EMS_fall 

## 2014-07-04 NOTE — ED Notes (Signed)
Called report to nursing home, [facility]. Staff concerned about "huge lump on pt's head." RN stated pt was palpated all over, hat taken off and no noticeable lump was visible. Staff stated it was over one of her eyes. Possible slight swelling over R eye noted. MD made aware and will CT pt[facility]

## 2014-07-04 NOTE — Discharge Instructions (Signed)
Fall Prevention in Hospitals °As a hospital patient, your condition and the treatments you receive can increase your risk for falls. Some additional risk factors for falls in a hospital include: °· Being in an unfamiliar environment. °· Being on bed rest. °· Your surgery. °· Taking certain medicines. °· Your tubing requirements, such as intravenous (IV) therapy or catheters. °It is important that you learn how to decrease fall risks while at the hospital. Below are important tips that can help prevent falls. °SAFETY TIPS FOR PREVENTING FALLS °Talk about your risk of falling. °· Ask your caregiver why you are at risk for falling. Is it your medicine, illness, tubing placement, or something else? °· Make a plan with your caregiver to keep you safe from falls. °· Ask your caregiver or pharmacist about side effect of your medicines. Some medicines can make you dizzy or affect your coordination. °Ask for help. °· Ask for help before getting out of bed. You may need to press your call button. °· Ask for assistance in getting you safely to the toilet. °· Ask for a walker or cane to be put at your bedside. Ask that most of the side rails on your bed be placed up before your caregiver leaves the room. °· Ask family or friends to sit with you. °· Ask for things that are out of your reach, such as your glasses, hearing aids, telephone, bedside table, or call button. °Follow these tips to avoid falling: °· Stay lying or seated, rather than standing, while waiting for help. °· Wear rubber-soled slippers or shoes whenever you walk in the hospital. °· Avoid quick, sudden movements. °¨ Change positions slowly. °¨ Sit on the side of your bed before standing. °¨ Stand up slowly and wait before you start to walk. °· Let your caregiver know if there is a spill on the floor. °· Pay careful attention to the medical equipment, electrical cords, and tubes around you. °· When you need help, use your call button by your bed or in the  bathroom. Wait for one of your caregivers to help you. °· If you feel dizzy or unsure of your footing, return to bed and wait for assistance. °· Avoid being distracted by the TV, telephone, or another person in your room. °· Do not lean or support yourself on rolling objects, such as IV poles or bedside tables. °Document Released: 05/27/2000 Document Revised: 05/16/2012 Document Reviewed: 02/05/2012 °Exit[employer]® Patient Information ©2015 [employer]. This information is not intended to replace advice given to you by your health care provider. Make sure you discuss any questions you have with your health care provider. ° °

## 2014-07-04 NOTE — ED Notes (Signed)
Pt ambulated to bathroom with minimal  assistance . RN and MD notifed

## 2014-07-04 NOTE — ED Notes (Signed)
[facility]. Unwitnessed, unknown LOC. History of falls. No c/o at this time.   Hx Dementia, is at her baseline

## 2014-07-25 ENCOUNTER — Emergency Department (HOSPITAL_COMMUNITY): Payer: Medicare Other

## 2014-07-25 ENCOUNTER — Emergency Department (HOSPITAL_COMMUNITY)
Admission: EM | Admit: 2014-07-25 | Discharge: 2014-07-25 | Disposition: A | Discharge status: Home / Self Care | Payer: Medicare Other | Attending: Emergency Medicine | Admitting: Emergency Medicine

## 2014-07-25 ENCOUNTER — Encounter (HOSPITAL_COMMUNITY): Payer: Self-pay | Admitting: Emergency Medicine

## 2014-07-25 DIAGNOSIS — Y9389 Activity, other specified: Secondary | ICD-10-CM | POA: Diagnosis not present

## 2014-07-25 DIAGNOSIS — N189 Chronic kidney disease, unspecified: Secondary | ICD-10-CM | POA: Diagnosis not present

## 2014-07-25 DIAGNOSIS — Y998 Other external cause status: Secondary | ICD-10-CM | POA: Diagnosis not present

## 2014-07-25 DIAGNOSIS — Y92002 Bathroom of unspecified non-institutional (private) residence single-family (private) house as the place of occurrence of the external cause: Secondary | ICD-10-CM | POA: Diagnosis not present

## 2014-07-25 DIAGNOSIS — Z79899 Other long term (current) drug therapy: Secondary | ICD-10-CM | POA: Insufficient documentation

## 2014-07-25 DIAGNOSIS — Z8739 Personal history of other diseases of the musculoskeletal system and connective tissue: Secondary | ICD-10-CM | POA: Insufficient documentation

## 2014-07-25 DIAGNOSIS — W1839XA Other fall on same level, initial encounter: Secondary | ICD-10-CM | POA: Insufficient documentation

## 2014-07-25 DIAGNOSIS — F0390 Unspecified dementia without behavioral disturbance: Secondary | ICD-10-CM | POA: Insufficient documentation

## 2014-07-25 DIAGNOSIS — I129 Hypertensive chronic kidney disease with stage 1 through stage 4 chronic kidney disease, or unspecified chronic kidney disease: Secondary | ICD-10-CM | POA: Insufficient documentation

## 2014-07-25 DIAGNOSIS — W19XXXA Unspecified fall, initial encounter: Secondary | ICD-10-CM

## 2014-07-25 DIAGNOSIS — S4992XA Unspecified injury of left shoulder and upper arm, initial encounter: Secondary | ICD-10-CM | POA: Insufficient documentation

## 2014-07-25 DIAGNOSIS — Z87442 Personal history of urinary calculi: Secondary | ICD-10-CM | POA: Diagnosis not present

## 2014-07-25 LAB — URINALYSIS, ROUTINE W REFLEX MICROSCOPIC
Bilirubin Urine: NEGATIVE
Glucose, UA: NEGATIVE mg/dL
Ketones, ur: NEGATIVE mg/dL
Nitrite: NEGATIVE
Protein, ur: NEGATIVE mg/dL
Specific Gravity, Urine: 1.022 (ref 1.005–1.030)
Urobilinogen, UA: 1 mg/dL (ref 0.0–1.0)
pH: 5.5 (ref 5.0–8.0)

## 2014-07-25 LAB — I-STAT CHEM 8, ED
BUN: 19 mg/dL (ref 6–23)
CALCIUM ION: 1.19 mmol/L (ref 1.13–1.30)
CREATININE: 0.8 mg/dL (ref 0.50–1.10)
Chloride: 105 mmol/L (ref 96–112)
GLUCOSE: 94 mg/dL (ref 70–99)
HCT: 38 % (ref 36.0–46.0)
Hemoglobin: 12.9 g/dL (ref 12.0–15.0)
Potassium: 3.7 mmol/L (ref 3.5–5.1)
Sodium: 143 mmol/L (ref 135–145)
TCO2: 22 mmol/L (ref 0–100)

## 2014-07-25 LAB — URINE MICROSCOPIC-ADD ON

## 2014-07-25 NOTE — ED Notes (Signed)
Family at bedside. 

## 2014-07-25 NOTE — ED Provider Notes (Signed)
CSN: [id number]     Arrival date & time 07/25/14  0243 History   This chart was scribed for Tomasita Crumble, MD by Abel Presto, ED Scribe. This patient was seen in room A02C/A02C and the patient's care was started at 3:44 AM.    Chief Complaint  Patient presents with  . Fall    Patient is a 80 y.o. female presenting with fall. The history is provided by a relative. The history is limited by the condition of the patient. No language interpreter was used.  Fall  LEVEL 5 CAVEAT HPI Comments: [name] is a 80 y.o. female brought in by ambulance, with PMHx of abnormality of gait, dementia, spondylosis, HTN, and chronic kidney disease who presents to the Emergency Department complaining of fall this evening.  Pt's family member notes fall was not witnessed. Pt is a resident at [facility]. Relative states pt was found lying on floor. EMS notes pt was complaining of left shoulder pain. Pt has been seen with more frequent occurences of falls in last few months.   Past Medical History  Diagnosis Date  . Hallucinations   . Abnormality of gait   . Spondylosis, cervical, with myelopathy   . Dementia   . Hypertension   . Chronic kidney disease     kidney stones   Past Surgical History  Procedure Laterality Date  . Lumbar laminectomy    . Knee arthroscopy    . C-spine     Family History  Problem Relation Age of Onset  . Hypertension Mother   . Hypertension Father    History  Substance Use Topics  . Smoking status: Never Smoker   . Smokeless tobacco: Never Used  . Alcohol Use: No   OB History    No data available     Review of Systems  Unable to perform ROS: Dementia   A complete 10 system review of systems was obtained and all systems are negative except as noted in the HPI and PMH.     Allergies  Review of patient's allergies indicates no known allergies.  Home Medications   Prior to Admission medications   Medication Sig Start Date End Date Taking? Authorizing  Provider  acetaminophen (TYLENOL) 500 MG tablet Take 500 mg by mouth every 4 (four) hours as needed for pain.    Historical Provider, MD  ALPRAZolam Prudy Feeler) 0.25 MG tablet Take 0.25 mg by mouth 2 (two) times daily. Or every 6 hours as needed for anxiety    Historical Provider, MD  Alum & Mag Hydroxide-Simeth (GERI-LANTA PO) Take 30 mLs by mouth 4 (four) times daily as needed (heartburn/indigestion).    Historical Provider, MD  azelastine (OPTIVAR) 0.05 % ophthalmic solution Place 1 drop into both eyes 2 (two) times daily.    Historical Provider, MD  bumetanide (BUMEX) 0.5 MG tablet Take 0.5 mg by mouth daily.    Historical Provider, MD  divalproex (DEPAKOTE) 125 MG DR tablet Take 125-250 mg by mouth 2 (two) times daily. Take  in the morning and  in the evening    Historical Provider, MD  guaiFENesin (ROBITUSSIN) 100 MG/5ML SOLN Take 10 mLs by mouth every 6 (six) hours as needed for cough.     Historical Provider, MD  haloperidol (HALDOL) 0.5 MG tablet Take 0.5 mg by mouth every 4 (four) hours as needed for agitation.    Historical Provider, MD  HYDROcodone-acetaminophen (NORCO/VICODIN) 5-325 MG per tablet Take 1 tablet by mouth every 12 (twelve) hours.  Historical Provider, MD  loperamide (IMODIUM A-D) 2 MG tablet Take 2 mg by mouth 4 (four) times daily as needed for diarrhea or loose stools.    Historical Provider, MD  magnesium hydroxide (MILK OF MAGNESIA) 400 MG/5ML suspension Take 30 mLs by mouth at bedtime as needed for mild constipation.     Historical Provider, MD  Menthol-Zinc Oxide (RISAMINE EX) Apply 1 application topically daily as needed (to Peri area with each incontinent change).    Historical Provider, MD  Neomycin-Bacitracin-Polymyxin (TRIPLE ANTIBIOTIC EX) Apply 1 application topically daily as needed (wound care).    Historical Provider, MD  pravastatin (PRAVACHOL) 40 MG tablet Take 40 mg by mouth daily.  10/18/12   Historical Provider, MD  risperiDONE (RISPERDAL) 1 MG/ML  oral solution Take 0.5 mg by mouth 2 (two) times daily.    Historical Provider, MD  rivastigmine (EXELON) 1.5 MG capsule Take 1.5 mg by mouth 2 (two) times daily.    Historical Provider, MD  traMADol (ULTRAM) 50 MG tablet Take 50 mg by mouth every 4 (four) hours as needed for moderate pain.    Historical Provider, MD  Vitamin D, Ergocalciferol, (DRISDOL) 50000 UNITS CAPS capsule Take 50,000 Units by mouth every 7 (seven) days. ON THURSDAYS    Historical Provider, MD   BP 136/55 mmHg  Pulse 57  Temp(Src) 97.7 F (36.5 C) (Oral)  Resp 18  SpO2 100% Physical Exam  Constitutional: She is oriented to person, place, and time. She appears well-developed and well-nourished. No distress.  Garbled speech at baseline Intermittently following commands Pt drooling.   HENT:  Head: Normocephalic and atraumatic.  Nose: Nose normal.  Mouth/Throat: Oropharynx is clear and moist. No oropharyngeal exudate.  Eyes: Conjunctivae and EOM are normal. Pupils are equal, round, and reactive to light. No scleral icterus.  Neck: Normal range of motion. Neck supple. No JVD present. No tracheal deviation present. No thyromegaly present.  Cardiovascular: Normal rate, regular rhythm and normal heart sounds.  Exam reveals no gallop and no friction rub.   No murmur heard. Pulmonary/Chest: Effort normal and breath sounds normal. No respiratory distress. She has no wheezes. She exhibits no tenderness.  Abdominal: Soft. Bowel sounds are normal. She exhibits no distension and no mass. There is no tenderness. There is no rebound and no guarding.  Musculoskeletal: Normal range of motion. She exhibits no edema or tenderness.  1+ pitting edema in bilateral LE Normal strength UEs Chronically flexed left hand Cannot test strength in LEs, pt not following commands  Lymphadenopathy:    She has no cervical adenopathy.  Neurological: She is alert and oriented to person, place, and time. No cranial nerve deficit. She exhibits normal  muscle tone.  Skin: Skin is warm and dry. No rash noted. No erythema. No pallor.  Nursing note and vitals reviewed.   ED Course  Procedures (including critical care time) DIAGNOSTIC STUDIES: Oxygen Saturation is 100% on room air, normal by my interpretation.    COORDINATION OF CARE: 3:50 AM Discussed treatment plan with patient at beside, the patient agrees with the plan and has no further questions at this time.   Labs Review Labs Reviewed  URINALYSIS, ROUTINE W REFLEX MICROSCOPIC - Abnormal; Notable for the following:    Hgb urine dipstick LARGE (*)    Leukocytes, UA SMALL (*)    All other components within normal limits  URINE MICROSCOPIC-ADD ON  I-STAT CHEM 8, ED    Imaging Review Ct Head Wo Contrast  07/25/2014   CLINICAL DATA:  Status post suspected fall; found sitting on floor. Initial encounter.  EXAM: CT HEAD WITHOUT CONTRAST  TECHNIQUE: Contiguous axial images were obtained from the base of the skull through the vertex without intravenous contrast.  COMPARISON:  CT of the head performed 07/04/2014, and MRI of the brain from 02/10/2013  FINDINGS: There is no evidence of acute infarction, mass lesion, or intra- or extra-axial hemorrhage on CT.  Prominence of the ventricles and sulci reflects mild to moderate cortical volume loss. Mild cerebellar atrophy is noted. Scattered periventricular and subcortical white matter change likely reflects small vessel ischemic microangiopathy.  The brainstem and fourth ventricle are within normal limits. The basal ganglia are unremarkable in appearance. The cerebral hemispheres demonstrate grossly normal gray-white differentiation. No mass effect or midline shift is seen.  There is no evidence of fracture; visualized osseous structures are unremarkable in appearance. The visualized portions of the orbits are within normal limits. A small mucus retention cyst or polyp is noted within the right maxillary sinus. The remaining paranasal sinuses and  mastoid air cells are well-aerated. No significant soft tissue abnormalities are seen.  IMPRESSION: 1. No acute intracranial pathology seen on CT. 2. Mild moderate cortical volume loss and scattered small vessel ischemic microangiopathy. 3. Small mucus retention cyst or polyp within the right maxillary sinus.   Electronically Signed   By: Roanna RaiderJeffery  Chang M.D.   On: 07/25/2014 03:46     EKG Interpretation None      MDM   Final diagnoses:  Fall    Patient presents after unwitnessed fall.  No further history could be obtained.  Will obtain CT head, infectious workup and chem 8 for evaluation.  Physical exam does not reveal any signs of external injury, she is not on any blood thinners.  She denies pain anywhere to me in the room.  Work up is negative, patient is safe for DC back to her facility.  I personally performed the services described in this documentation, which was scribed in my presence. The recorded information has been reviewed and is accurate.     Tomasita CrumbleAdeleke Ryder Man, MD 07/25/14 [phone]

## 2014-07-25 NOTE — ED Notes (Signed)
[facility] with c/o possible fall. Pt was reportedly found sitting on the floor leaning to the L side. Per EMS, she had part of her brief pulled down as if she had gotten up to use the bathroom. Per EMS, pt is at baseline mental status.

## 2014-07-25 NOTE — ED Notes (Signed)
Patient transported to CT 

## 2014-07-25 NOTE — Discharge Instructions (Signed)
Fall Prevention and Home Safety Ms. [name], follow-up with your primary care physician within 3 days regarding preventing falls at home. If symptoms worsen come back to emergency department immediately. Thank you. Falls cause injuries and can affect all age groups. It is possible to prevent falls.  HOW TO PREVENT FALLS  Wear shoes with rubber soles that do not have an opening for your toes.  Keep the inside and outside of your house well lit.  Use night lights throughout your home.  Remove clutter from floors.  Clean up floor spills.  Remove throw rugs or fasten them to the floor with carpet tape.  Do not place electrical cords across pathways.  Put grab bars by your tub, shower, and toilet. Do not use towel bars as grab bars.  Put handrails on both sides of the stairway. Fix loose handrails.  Do not climb on stools or stepladders, if possible.  Do not wax your floors.  Repair uneven or unsafe sidewalks, walkways, or stairs.  Keep items you use a lot within reach.  Be aware of pets.  Keep emergency numbers next to the telephone.  Put smoke detectors in your home and near bedrooms. Ask your doctor what other things you can do to prevent falls. Document Released: 03/26/2009 Document Revised: 11/29/2011 Document Reviewed: 08/30/2011 [facility] Patient Information 2015 [employer] [place]. This information is not intended to replace advice given to you by your health care provider. Make sure you discuss any questions you have with your health care provider.

## 2014-07-25 NOTE — ED Notes (Signed)
Patient's daughter, [name]  is alert and orientedx4.  Patient's daughter was explained discharge instructions and they understood them with no questions.  Ms.Walls Is taking the patient home.

## 2014-07-31 LAB — BASIC METABOLIC PANEL: POTASSIUM: 4 mmol/L (ref 3.4–5.3)

## 2014-08-02 ENCOUNTER — Emergency Department (HOSPITAL_COMMUNITY): Payer: Medicare Other

## 2014-08-02 ENCOUNTER — Emergency Department (HOSPITAL_COMMUNITY)
Admission: EM | Admit: 2014-08-02 | Discharge: 2014-08-02 | Disposition: A | Discharge status: Home / Self Care | Payer: Medicare Other | Attending: Emergency Medicine | Admitting: Emergency Medicine

## 2014-08-02 ENCOUNTER — Encounter (HOSPITAL_COMMUNITY): Payer: Self-pay | Admitting: Emergency Medicine

## 2014-08-02 DIAGNOSIS — F0390 Unspecified dementia without behavioral disturbance: Secondary | ICD-10-CM | POA: Insufficient documentation

## 2014-08-02 DIAGNOSIS — Y998 Other external cause status: Secondary | ICD-10-CM | POA: Insufficient documentation

## 2014-08-02 DIAGNOSIS — I129 Hypertensive chronic kidney disease with stage 1 through stage 4 chronic kidney disease, or unspecified chronic kidney disease: Secondary | ICD-10-CM | POA: Diagnosis not present

## 2014-08-02 DIAGNOSIS — Y9389 Activity, other specified: Secondary | ICD-10-CM | POA: Insufficient documentation

## 2014-08-02 DIAGNOSIS — T148XXA Other injury of unspecified body region, initial encounter: Secondary | ICD-10-CM

## 2014-08-02 DIAGNOSIS — W19XXXA Unspecified fall, initial encounter: Secondary | ICD-10-CM | POA: Diagnosis not present

## 2014-08-02 DIAGNOSIS — Z043 Encounter for examination and observation following other accident: Secondary | ICD-10-CM | POA: Insufficient documentation

## 2014-08-02 DIAGNOSIS — N189 Chronic kidney disease, unspecified: Secondary | ICD-10-CM | POA: Insufficient documentation

## 2014-08-02 DIAGNOSIS — Z79899 Other long term (current) drug therapy: Secondary | ICD-10-CM | POA: Insufficient documentation

## 2014-08-02 DIAGNOSIS — Y92129 Unspecified place in nursing home as the place of occurrence of the external cause: Secondary | ICD-10-CM | POA: Insufficient documentation

## 2014-08-02 DIAGNOSIS — Z8739 Personal history of other diseases of the musculoskeletal system and connective tissue: Secondary | ICD-10-CM | POA: Insufficient documentation

## 2014-08-02 NOTE — ED Notes (Signed)
Attempted to call report without success. [phone] no answer

## 2014-08-02 NOTE — Discharge Instructions (Signed)
We saw you in the ER after you had a fall. °All the imaging results are normal, no fractures seen. No evidence of brain bleed. °Please be very careful with walking, and do everything possible to prevent falls. ° ° °Fall Prevention and Home Safety °Falls cause injuries and can affect all age groups. It is possible to use preventive measures to significantly decrease the likelihood of falls. There are many simple measures which can make your home safer and prevent falls. °OUTDOORS °· Repair cracks and edges of walkways and driveways. °· Remove high doorway thresholds. °· Trim shrubbery on the main path into your home. °· Have good outside lighting. °· Clear walkways of tools, rocks, debris, and clutter. °· Check that handrails are not broken and are securely fastened. Both sides of steps should have handrails. °· Have leaves, snow, and ice cleared regularly. °· Use sand or salt on walkways during winter months. °· In the garage, clean up grease or oil spills. °BATHROOM °· Install night lights. °· Install grab bars by the toilet and in the tub and shower. °· Use non-skid mats or decals in the tub or shower. °· Place a plastic non-slip stool in the shower to sit on, if needed. °· Keep floors dry and clean up all water on the floor immediately. °· Remove soap buildup in the tub or shower on a regular basis. °· Secure bath mats with non-slip, double-sided rug tape. °· Remove throw rugs and tripping hazards from the floors. °BEDROOMS °· Install night lights. °· Make sure a bedside light is easy to reach. °· Do not use oversized bedding. °· Keep a telephone by your bedside. °· Have a firm chair with side arms to use for getting dressed. °· Remove throw rugs and tripping hazards from the floor. °KITCHEN °· Keep handles on pots and pans turned toward the center of the stove. Use back burners when possible. °· Clean up spills quickly and allow time for drying. °· Avoid walking on wet floors. °· Avoid hot utensils and  knives. °· Position shelves so they are not too high or low. °· Place commonly used objects within easy reach. °· If necessary, use a sturdy step stool with a grab bar when reaching. °· Keep electrical cables out of the way. °· Do not use floor polish or wax that makes floors slippery. If you must use wax, use non-skid floor wax. °· Remove throw rugs and tripping hazards from the floor. °STAIRWAYS °· Never leave objects on stairs. °· Place handrails on both sides of stairways and use them. Fix any loose handrails. Make sure handrails on both sides of the stairways are as long as the stairs. °· Check carpeting to make sure it is firmly attached along stairs. Make repairs to worn or loose carpet promptly. °· Avoid placing throw rugs at the top or bottom of stairways, or properly secure the rug with carpet tape to prevent slippage. Get rid of throw rugs, if possible. °· Have an electrician put in a light switch at the top and bottom of the stairs. °OTHER FALL PREVENTION TIPS °· Wear low-heel or rubber-soled shoes that are supportive and fit well. Wear closed toe shoes. °· When using a stepladder, make sure it is fully opened and both spreaders are firmly locked. Do not climb a closed stepladder. °· Add color or contrast paint or tape to grab bars and handrails in your home. Place contrasting color strips on first and last steps. °· Learn and use mobility aids as   needed. Install an electrical emergency response system. °· Turn on lights to avoid dark areas. Replace light bulbs that burn out immediately. Get light switches that glow. °· Arrange furniture to create clear pathways. Keep furniture in the same place. °· Firmly attach carpet with non-skid or double-sided tape. °· Eliminate uneven floor surfaces. °· Select a carpet pattern that does not visually hide the edge of steps. °· Be aware of all pets. °OTHER HOME SAFETY TIPS °· Set the water temperature for 120° F (48.8° C). °· Keep emergency numbers on or near the  telephone. °· Keep smoke detectors on every level of the home and near sleeping areas. °Document Released: 05/20/2002 Document Revised: 11/29/2011 Document Reviewed: 08/19/2011 °ExitCare® Patient Information ©2015 [employer], LLC. This information is not intended to replace advice given to you by your health care provider. Make sure you discuss any questions you have with your health care provider. ° °

## 2014-08-02 NOTE — ED Notes (Signed)
[facility].  EMS reports pt had a unwitnessed fall from her bed and was found on the floor.  No obvious injuries noted.

## 2014-08-02 NOTE — ED Provider Notes (Signed)
CSN: [id number]     Arrival date & time 08/02/14  0117 History   First MD Initiated Contact with Patient 08/02/14 0158     This chart was scribed for Tanya KaplanAnkit Madissen Wyse, MD by Arlan OrganAshley Leger, ED Scribe. This patient was seen in room D35C/D35C and the patient's care was started 1:59 AM.   Chief Complaint  Patient presents with  . Fall   HPI  LEVEL 5 CAVEAT DUE TO DEMENTIA  HPI Comments: [name] is a 80 y.o. female with a PMHx of dementia, HTN, falls, and chronic kidney disease who presents to the Emergency Department complaining of an unwitnessed fall sustained just prior to arrival. [facility]. Per nurse note, pt was found on the floor next to her bed. Ms. [name] denies any pain at this time. Daughter states she is able to recognize her face at baseline but says history of dementia is advanced. States she does not ambulate much at baseline but states she is sometimes able to use a walker for assistance. No known allergies to medications.  Past Medical History  Diagnosis Date  . Hallucinations   . Abnormality of gait   . Spondylosis, cervical, with myelopathy   . Dementia   . Hypertension   . Chronic kidney disease     kidney stones   Past Surgical History  Procedure Laterality Date  . Lumbar laminectomy    . Knee arthroscopy    . C-spine     Family History  Problem Relation Age of Onset  . Hypertension Mother   . Hypertension Father    History  Substance Use Topics  . Smoking status: Never Smoker   . Smokeless tobacco: Never Used  . Alcohol Use: No   OB History    No data available     Review of Systems  Unable to perform ROS: Dementia      Allergies  Review of patient's allergies indicates no known allergies.  Home Medications   Prior to Admission medications   Medication Sig Start Date End Date Taking? Authorizing Provider  acetaminophen (TYLENOL) 500 MG tablet Take 500 mg by mouth every 4 (four) hours as needed for fever.    Yes  Historical Provider, MD  ALPRAZolam (XANAX) 0.25 MG tablet Take 0.25 mg by mouth 2 (two) times daily.    Yes Historical Provider, MD  ALPRAZolam (XANAX) 0.25 MG tablet Take 0.25 mg by mouth every 6 (six) hours as needed for anxiety.   Yes Historical Provider, MD  Alum & Mag Hydroxide-Simeth (GERI-LANTA PO) Take 30 mLs by mouth 4 (four) times daily as needed (heartburn/indigestion).   Yes Historical Provider, MD  azelastine (OPTIVAR) 0.05 % ophthalmic solution Place 1 drop into both eyes 2 (two) times daily.   Yes Historical Provider, MD  bumetanide (BUMEX) 0.5 MG tablet Take 0.5 mg by mouth daily.   Yes Historical Provider, MD  divalproex (DEPAKOTE) 125 MG DR tablet Take 125-250 mg by mouth 2 (two) times daily. Take 250mg  in the morning and 125mg  in the evening   Yes Historical Provider, MD  guaiFENesin (ROBITUSSIN) 100 MG/5ML SOLN Take 10 mLs by mouth every 6 (six) hours as needed for cough.    Yes Historical Provider, MD  haloperidol (HALDOL) 0.5 MG tablet Take 0.5 mg by mouth every 4 (four) hours as needed for agitation.   Yes Historical Provider, MD  HYDROcodone-acetaminophen (NORCO/VICODIN) 5-325 MG per tablet Take 1 tablet by mouth every 12 (twelve) hours.   Yes Historical  Provider, MD  loperamide (IMODIUM A-D) 2 MG tablet Take 2 mg by mouth 4 (four) times daily as needed for diarrhea or loose stools.   Yes Historical Provider, MD  magnesium hydroxide (MILK OF MAGNESIA) 400 MG/5ML suspension Take 30 mLs by mouth at bedtime as needed for mild constipation.    Yes Historical Provider, MD  Menthol-Zinc Oxide (RISAMINE EX) Apply 1 application topically daily as needed (to Peri area with each incontinent change).   Yes Historical Provider, MD  Neomycin-Bacitracin-Polymyxin (TRIPLE ANTIBIOTIC EX) Apply 1 application topically daily as needed (wound care).   Yes Historical Provider, MD  pravastatin (PRAVACHOL) 40 MG tablet Take 40 mg by mouth daily.  10/18/12  Yes Historical Provider, MD  risperiDONE  (RISPERDAL) 1 MG/ML oral solution Take 0.5 mg by mouth 2 (two) times daily.   Yes Historical Provider, MD  rivastigmine (EXELON) 1.5 MG capsule Take 1.5 mg by mouth 2 (two) times daily.   Yes Historical Provider, MD  UNABLE TO FIND Take 1 each by mouth 3 (three) times daily. Med Name: Mighty Shakes   Yes Historical Provider, MD  Vitamin D, Ergocalciferol, (DRISDOL) 50000 UNITS CAPS capsule Take 50,000 Units by mouth every 7 (seven) days. ON THURSDAYS   Yes Historical Provider, MD  azelastine (OPTIVAR) 0.05 % ophthalmic solution Place 1 drop into both eyes 2 (two) times daily.    Historical Provider, MD  traMADol (ULTRAM) 50 MG tablet Take 50 mg by mouth every 4 (four) hours as needed for moderate pain.    Historical Provider, MD   Triage Vitals: BP 111/51 mmHg  Pulse 54  Temp(Src) 98 F (36.7 C) (Oral)  Resp 16  Wt 140 lb (63.504 kg)  SpO2 100%   Physical Exam  Constitutional: She is oriented to person, place, and time. She appears well-developed and well-nourished. No distress.  HENT:  Head: Normocephalic and atraumatic.  No hematoma of scalp No facial ecchymosis  Eyes: EOM are normal.  Neck: Normal range of motion.  No midline c-spine tenderness, pt able to turn head to 45 degrees bilaterally without any pain and able to flex neck to the chest and extend without any pain or neurologic symptoms.   Cardiovascular: Normal rate, regular rhythm and normal heart sounds.   Pulmonary/Chest: Effort normal and breath sounds normal. She exhibits no tenderness.  Abdominal: Soft. She exhibits no distension. There is no tenderness.  Musculoskeletal: Normal range of motion.  No gross deformity of upper or lower extremities No tenderness to palpation over upper or lower extremitates  Pelvis is stable  Neurological: She is alert and oriented to person, place, and time.  Skin: Skin is warm and dry.  Psychiatric: She has a normal mood and affect. Judgment normal.  Nursing note and vitals  reviewed.   ED Course  Procedures (including critical care time)  DIAGNOSTIC STUDIES: Oxygen Saturation is 100% on RA, Normal by my interpretation.    COORDINATION OF CARE: 2:05 AM-Discussed treatment plan with pt at bedside and pt agreed to plan.     Labs Review Labs Reviewed - No data to display  Imaging Review Dg Pelvis 1-2 Views  08/02/2014   CLINICAL DATA:  Larey Seat.  Right hip pain.  EXAM: PELVIS - 1-2 VIEW  COMPARISON:  None.  FINDINGS: There is no evidence of pelvic fracture or diastasis. No pelvic bone lesions are seen. Moderate hip arthritis is present bilaterally.  IMPRESSION: Negative for acute fracture.   Electronically Signed   By: Rosey Bath.D.  On: 08/02/2014 03:03   Ct Head Wo Contrast  08/02/2014   CLINICAL DATA:  Unwitnessed fall at nursing home, found on ground. Dementia.  EXAM: CT HEAD WITHOUT CONTRAST  CT CERVICAL SPINE WITHOUT CONTRAST  TECHNIQUE: Multidetector CT imaging of the head and cervical spine was performed following the standard protocol without intravenous contrast. Multiplanar CT image reconstructions of the cervical spine were also generated.  COMPARISON:  CT of the head July 25, 2014  FINDINGS: CT HEAD FINDINGS  Mild motion degraded examination. The ventricles and sulci are normal for age. No intraparenchymal hemorrhage, mass effect nor midline shift. Patchy supratentorial white matter hypodensities are within normal range for patient's age and though non-specific suggest sequelae of chronic small vessel ischemic disease. No acute large vascular territory infarcts.  No abnormal extra-axial fluid collections. Basal cisterns are patent. Moderate calcific atherosclerosis of the carotid siphons.  No skull fracture. The included ocular globes and orbital contents are non-suspicious; left drusen. RIGHT maxillary mucosal retention cysts, no paranasal sinus air-fluid levels. The mastoid air cells are well aerated.  CT CERVICAL SPINE FINDINGS  Solid cervical  interbody fusion limits assessment for precise levels though, there appears to be a fusion from C2 through C6. Severe C6-7 disc height loss, endplate sclerosis, vacuum disc consistent with degenerative disc. Severe C1-2 osteoarthrosis with extensive calcified pannus resulting in at least mild canal stenosis, this likely represents CPPD. Bone mineral density is decreased. Nuchal ligament calcifications.  Solid T1 and T2 interbody arthrodesis.  IMPRESSION: CT HEAD: No acute intracranial process.  Involutional changes. Mild to moderate white matter changes suggest chronic small vessel ischemic disease.  CT CERVICAL SPINE: No acute fracture or malalignment.  Solid cervical fusion.   Electronically Signed   By: Awilda Metro   On: 08/02/2014 03:03   Ct Cervical Spine Wo Contrast  08/02/2014   CLINICAL DATA:  Unwitnessed fall at nursing home, found on ground. Dementia.  EXAM: CT HEAD WITHOUT CONTRAST  CT CERVICAL SPINE WITHOUT CONTRAST  TECHNIQUE: Multidetector CT imaging of the head and cervical spine was performed following the standard protocol without intravenous contrast. Multiplanar CT image reconstructions of the cervical spine were also generated.  COMPARISON:  CT of the head July 25, 2014  FINDINGS: CT HEAD FINDINGS  Mild motion degraded examination. The ventricles and sulci are normal for age. No intraparenchymal hemorrhage, mass effect nor midline shift. Patchy supratentorial white matter hypodensities are within normal range for patient's age and though non-specific suggest sequelae of chronic small vessel ischemic disease. No acute large vascular territory infarcts.  No abnormal extra-axial fluid collections. Basal cisterns are patent. Moderate calcific atherosclerosis of the carotid siphons.  No skull fracture. The included ocular globes and orbital contents are non-suspicious; left drusen. RIGHT maxillary mucosal retention cysts, no paranasal sinus air-fluid levels. The mastoid air cells are well  aerated.  CT CERVICAL SPINE FINDINGS  Solid cervical interbody fusion limits assessment for precise levels though, there appears to be a fusion from C2 through C6. Severe C6-7 disc height loss, endplate sclerosis, vacuum disc consistent with degenerative disc. Severe C1-2 osteoarthrosis with extensive calcified pannus resulting in at least mild canal stenosis, this likely represents CPPD. Bone mineral density is decreased. Nuchal ligament calcifications.  Solid T1 and T2 interbody arthrodesis.  IMPRESSION: CT HEAD: No acute intracranial process.  Involutional changes. Mild to moderate white matter changes suggest chronic small vessel ischemic disease.  CT CERVICAL SPINE: No acute fracture or malalignment.  Solid cervical fusion.   Electronically Signed  By: Awilda Metro   On: 08/02/2014 03:03     EKG Interpretation None      MDM   Final diagnoses:  Fall  Contusion    I personally performed the services described in this documentation, which was scribed in my presence. The recorded information has been reviewed and is accurate.  Pt comes in with cc of fall. DDx includes: - Mechanical falls - ICH - Fractures - Contusions - Soft tissue injury   Imaging is normal. CT head, cspine ordered due to fall, dementia with inability to get good exam. Pelvic films ordered, and they are neg as well. Family at bedside. States that this is patient's baseline mentation. They would prefer not to get UA - as it was neg last time she was here, and i am ok with that. Will d/c.  Tanya Kaplan, MD 08/02/14 [phone]

## 2014-08-02 NOTE — ED Notes (Signed)
[facility] called for transport.  

## 2014-08-02 NOTE — ED Notes (Signed)
[facility] here for transport. 

## 2014-08-02 NOTE — ED Notes (Signed)
Patient transported to CT 

## 2014-08-02 NOTE — ED Notes (Signed)
Patient incont of urine, attends changed.

## 2014-08-02 NOTE — ED Notes (Signed)
Family members state this is her normal mentation.  Family also stated that she never has a urinary tract infection.  Per Dr Rhunette CroftNanavati if family agrees this is her normal mentation no urine is needed.

## 2014-08-08 ENCOUNTER — Encounter (HOSPITAL_COMMUNITY): Payer: Self-pay | Admitting: *Deleted

## 2014-08-08 ENCOUNTER — Emergency Department (HOSPITAL_COMMUNITY): Payer: Medicare Other

## 2014-08-08 ENCOUNTER — Emergency Department (HOSPITAL_COMMUNITY)
Admission: EM | Admit: 2014-08-08 | Discharge: 2014-08-08 | Disposition: A | Discharge status: Home / Self Care | Payer: Medicare Other | Attending: Emergency Medicine | Admitting: Emergency Medicine

## 2014-08-08 DIAGNOSIS — N189 Chronic kidney disease, unspecified: Secondary | ICD-10-CM | POA: Diagnosis not present

## 2014-08-08 DIAGNOSIS — Z8739 Personal history of other diseases of the musculoskeletal system and connective tissue: Secondary | ICD-10-CM | POA: Insufficient documentation

## 2014-08-08 DIAGNOSIS — N390 Urinary tract infection, site not specified: Secondary | ICD-10-CM | POA: Diagnosis not present

## 2014-08-08 DIAGNOSIS — Z043 Encounter for examination and observation following other accident: Secondary | ICD-10-CM | POA: Insufficient documentation

## 2014-08-08 DIAGNOSIS — F0390 Unspecified dementia without behavioral disturbance: Secondary | ICD-10-CM | POA: Diagnosis not present

## 2014-08-08 DIAGNOSIS — I129 Hypertensive chronic kidney disease with stage 1 through stage 4 chronic kidney disease, or unspecified chronic kidney disease: Secondary | ICD-10-CM | POA: Insufficient documentation

## 2014-08-08 DIAGNOSIS — Z79899 Other long term (current) drug therapy: Secondary | ICD-10-CM | POA: Diagnosis not present

## 2014-08-08 DIAGNOSIS — W19XXXA Unspecified fall, initial encounter: Secondary | ICD-10-CM

## 2014-08-08 HISTORY — DX: Spondylosis without myelopathy or radiculopathy, cervical region: M47.812

## 2014-08-08 LAB — CBC WITH DIFFERENTIAL/PLATELET
BASOS ABS: 0 10*3/uL (ref 0.0–0.1)
Basophils Relative: 0 % (ref 0–1)
EOS PCT: 1 % (ref 0–5)
Eosinophils Absolute: 0.1 10*3/uL (ref 0.0–0.7)
HCT: 35.1 % — ABNORMAL LOW (ref 36.0–46.0)
HEMOGLOBIN: 11.2 g/dL — AB (ref 12.0–15.0)
LYMPHS ABS: 1.1 10*3/uL (ref 0.7–4.0)
Lymphocytes Relative: 9 % — ABNORMAL LOW (ref 12–46)
MCH: 29.2 pg (ref 26.0–34.0)
MCHC: 31.9 g/dL (ref 30.0–36.0)
MCV: 91.6 fL (ref 78.0–100.0)
MONOS PCT: 8 % (ref 3–12)
Monocytes Absolute: 1 10*3/uL (ref 0.1–1.0)
NEUTROS PCT: 82 % — AB (ref 43–77)
Neutro Abs: 10.3 10*3/uL — ABNORMAL HIGH (ref 1.7–7.7)
Platelets: 168 10*3/uL (ref 150–400)
RBC: 3.83 MIL/uL — AB (ref 3.87–5.11)
RDW: 13.4 % (ref 11.5–15.5)
WBC: 12.5 10*3/uL — AB (ref 4.0–10.5)

## 2014-08-08 LAB — URINALYSIS, ROUTINE W REFLEX MICROSCOPIC
Bilirubin Urine: NEGATIVE
GLUCOSE, UA: NEGATIVE mg/dL
Ketones, ur: NEGATIVE mg/dL
Nitrite: NEGATIVE
PROTEIN: 100 mg/dL — AB
Urobilinogen, UA: 0.2 mg/dL (ref 0.0–1.0)
pH: 6 (ref 5.0–8.0)

## 2014-08-08 LAB — BASIC METABOLIC PANEL
Anion gap: 8 (ref 5–15)
BUN: 17 mg/dL (ref 6–23)
CO2: 27 mmol/L (ref 19–32)
Calcium: 9 mg/dL (ref 8.4–10.5)
Chloride: 105 mmol/L (ref 96–112)
Creatinine, Ser: 0.73 mg/dL (ref 0.50–1.10)
GFR calc Af Amer: >90 mL/min (ref >90)
GFR, EST NON AFRICAN AMERICAN: 80 mL/min — AB (ref >90)
GLUCOSE: 86 mg/dL (ref 70–99)
POTASSIUM: 4.2 mmol/L (ref 3.5–5.1)
SODIUM: 140 mmol/L (ref 135–145)

## 2014-08-08 LAB — URINE MICROSCOPIC-ADD ON

## 2014-08-08 LAB — I-STAT TROPONIN, ED: Troponin i, poc: 0.01 ng/mL (ref 0.00–0.08)

## 2014-08-08 LAB — CBG MONITORING, ED: Glucose-Capillary: 93 mg/dL (ref 70–99)

## 2014-08-08 MED ORDER — CEPHALEXIN 500 MG PO CAPS: 500.0000 mg | ORAL_CAPSULE | Freq: Three times a day (TID) | ORAL | Status: DC

## 2014-08-08 NOTE — Progress Notes (Signed)
  CARE MANAGEMENT ED NOTE 08/08/2014  Patient:  Elaina HoopsHENRY,Avelyn E   Account Number:  [id number]  Date Initiated:  08/08/2014  Documentation initiated by:  Edd ArbourGIBBS,KIMBERLY  Subjective/Objective Assessment:   80 [facility] [address]  Pt was being driven here by [facility] for unwitnessed and her cbg was [id number]     Subjective/Objective Assessment Detail:   pcp samuel bowen     Action/Plan:   ED CM received a call from ChupaderoLisa PA stating daughter voiced interest in pt going [facility] another [facility] Cm shared with Misty StanleyLisa that SW assists with facilities but if pt medically clear generally pt would return to   Action/Plan Detail:   facility & can be assisted by facility with a transfer or the daughter can initiated transfer with her facility of choice by contacting them.  Cm called ED SW Dahlia ClientHannah & left a voice msg at [phone]   Anticipated DC Date:  08/08/2014     Status Recommendation to Physician:   Result of Recommendation:    Other ED Services  Consult Working Plan   In-house referral  Clinical Social Worker   DC Associate [employer]lanning Services  Other  Outpatient Services - Pt will follow up    Choice offered to / List presented to:            Status of service:  Completed, signed off  ED Comments:   ED Comments Detail:

## 2014-08-08 NOTE — ED Provider Notes (Signed)
CSN: [id number]     Arrival date & time 08/08/14  4098 History   First MD Initiated Contact with Patient 08/08/14 [phone]     Chief Complaint  Patient presents with  . Fall    unwitnessed     (Consider location/radiation/quality/duration/timing/severity/associated sxs/prior Treatment) Patient is a 80 y.o. female presenting with fall. The history is provided by the EMS personnel, the nursing home and medical records.  Fall   LEVEL 5 CAVEAT:  DEMENTIA This is a 80 year old female with past medical history significant for baseline gait abnormality, advanced dementia, hypertension, spondylosis, presenting to the ED following an unwitnessed fall. Patient is unable to provide any history at this time. I have called her nursing facility, [facility], and spoke with her nurse Crystal.  States she was found down this morning on her back. Unsure of head injury or loss of consciousness. She states her gait abnormality causes her to drag her feet so she does have frequent falls. Denies recent fevers or illness. No changes in medications. States her baseline mental status is minimal responses, usually yes or no.  Patient not currently on anti-coagulation. She does have hx of UTI in the past.  VSS en route, CBG was noted to be 68.  Patient given 1g IM glucagon in right thigh.  Past Medical History  Diagnosis Date  . Hallucinations   . Abnormality of gait   . Spondylosis, cervical, with myelopathy   . Dementia   . Hypertension   . Chronic kidney disease     kidney stones   Past Surgical History  Procedure Laterality Date  . Lumbar laminectomy    . Knee arthroscopy    . C-spine     Family History  Problem Relation Age of Onset  . Hypertension Mother   . Hypertension Father    History  Substance Use Topics  . Smoking status: Never Smoker   . Smokeless tobacco: Never Used  . Alcohol Use: No   OB History    No data available     Review of Systems  Unable to perform ROS: Dementia       Allergies  Review of patient's allergies indicates no known allergies.  Home Medications   Prior to Admission medications   Medication Sig Start Date End Date Taking? Authorizing Provider  acetaminophen (TYLENOL) 500 MG tablet Take 500 mg by mouth every 4 (four) hours as needed for fever.     Historical Provider, MD  ALPRAZolam Prudy Feeler) 0.25 MG tablet Take 0.25 mg by mouth 2 (two) times daily.     Historical Provider, MD  ALPRAZolam Prudy Feeler) 0.25 MG tablet Take 0.25 mg by mouth every 6 (six) hours as needed for anxiety.    Historical Provider, MD  Alum & Mag Hydroxide-Simeth (GERI-LANTA PO) Take 30 mLs by mouth 4 (four) times daily as needed (heartburn/indigestion).    Historical Provider, MD  azelastine (OPTIVAR) 0.05 % ophthalmic solution Place 1 drop into both eyes 2 (two) times daily.    Historical Provider, MD  azelastine (OPTIVAR) 0.05 % ophthalmic solution Place 1 drop into both eyes 2 (two) times daily.    Historical Provider, MD  bumetanide (BUMEX) 0.5 MG tablet Take 0.5 mg by mouth daily.    Historical Provider, MD  divalproex (DEPAKOTE) 125 MG DR tablet Take 125-250 mg by mouth 2 (two) times daily. Take  in the morning and  in the evening    Historical Provider, MD  guaiFENesin (ROBITUSSIN) 100 MG/5ML SOLN Take 10 mLs by mouth  every 6 (six) hours as needed for cough.     Historical Provider, MD  haloperidol (HALDOL) 0.5 MG tablet Take 0.5 mg by mouth every 4 (four) hours as needed for agitation.    Historical Provider, MD  HYDROcodone-acetaminophen (NORCO/VICODIN) 5-325 MG per tablet Take 1 tablet by mouth every 12 (twelve) hours.    Historical Provider, MD  loperamide (IMODIUM A-D) 2 MG tablet Take 2 mg by mouth 4 (four) times daily as needed for diarrhea or loose stools.    Historical Provider, MD  magnesium hydroxide (MILK OF MAGNESIA) 400 MG/5ML suspension Take 30 mLs by mouth at bedtime as needed for mild constipation.     Historical Provider, MD  Menthol-Zinc  Oxide (RISAMINE EX) Apply 1 application topically daily as needed (to Peri area with each incontinent change).    Historical Provider, MD  Neomycin-Bacitracin-Polymyxin (TRIPLE ANTIBIOTIC EX) Apply 1 application topically daily as needed (wound care).    Historical Provider, MD  pravastatin (PRAVACHOL) 40 MG tablet Take 40 mg by mouth daily.  10/18/12   Historical Provider, MD  risperiDONE (RISPERDAL) 1 MG/ML oral solution Take 0.5 mg by mouth 2 (two) times daily.    Historical Provider, MD  rivastigmine (EXELON) 1.5 MG capsule Take 1.5 mg by mouth 2 (two) times daily.    Historical Provider, MD  traMADol (ULTRAM) 50 MG tablet Take 50 mg by mouth every 4 (four) hours as needed for moderate pain.    Historical Provider, MD  UNABLE TO FIND Take 1 each by mouth 3 (three) times daily. Med Name: Mighty Shakes    Historical Provider, MD  Vitamin D, Ergocalciferol, (DRISDOL) 50000 UNITS CAPS capsule Take 50,000 Units by mouth every 7 (seven) days. ON THURSDAYS    Historical Provider, MD   BP 127/108 mmHg  Pulse 63  Temp(Src) 96.6 F (35.9 C) (Rectal)  Resp 12  SpO2 100%   Physical Exam  Constitutional: She appears well-developed and well-nourished. No distress.  HENT:  Head: Normocephalic and atraumatic.  Mouth/Throat: Oropharynx is clear and moist.  Small abrasion noted to top of scalp, appears old  Eyes: Conjunctivae and EOM are normal. Pupils are equal, round, and reactive to light.  Neck: Normal range of motion. Neck supple.  Cardiovascular: Normal rate, regular rhythm and normal heart sounds.   Pulmonary/Chest: Effort normal. No respiratory distress. She has wheezes.  Diffuse expiratory wheezes throughout, no distress, noted, O2 sats 100%  Abdominal: Soft. Bowel sounds are normal. There is no tenderness. There is no guarding.  Musculoskeletal: Normal range of motion.  Hips apparently non-tender, pelvis feels stable Full ROM of bilateral hips and knee without apparent pain Well healed  midline lumbar surgical incision, no deformities noted to T/L spine c-spine immobilized in cervical collar  Neurological: She is alert.  Awake, minimal responses to questioning, mostly saying "yea" and incomprehensible mumbling; moving BUE well, limited movement of BLE  Skin: Skin is warm and dry. She is not diaphoretic.  Psychiatric: She has a normal mood and affect.  Nursing note and vitals reviewed.   ED Course  Procedures (including critical care time) Labs Review Labs Reviewed  BASIC METABOLIC PANEL - Abnormal; Notable for the following:    GFR calc non Af Amer 80 (*)    All other components within normal limits  URINALYSIS, ROUTINE W REFLEX MICROSCOPIC - Abnormal; Notable for the following:    Color, Urine STRAW (*)    APPearance TURBID (*)    Specific Gravity, Urine >1.030 (*)  Hgb urine dipstick MODERATE (*)    Protein, ur 100 (*)    Leukocytes, UA MODERATE (*)    All other components within normal limits  CBC WITH DIFFERENTIAL/PLATELET - Abnormal; Notable for the following:    WBC 12.5 (*)    RBC 3.83 (*)    Hemoglobin 11.2 (*)    HCT 35.1 (*)    Neutrophils Relative % 82 (*)    Neutro Abs 10.3 (*)    Lymphocytes Relative 9 (*)    All other components within normal limits  URINE MICROSCOPIC-ADD ON - Abnormal; Notable for the following:    Squamous Epithelial / LPF MANY (*)    Bacteria, UA MANY (*)    All other components within normal limits  URINE CULTURE  CBC WITH DIFFERENTIAL/PLATELET  CBG MONITORING, ED  Rosezena Sensor, ED    Imaging Review Dg Chest 2 View  08/08/2014   CLINICAL DATA:  Wheezing.  Recent fall  EXAM: CHEST  2 VIEW  COMPARISON:  July 25, 2014  FINDINGS: There is no edema or consolidation. Heart is upper normal in size with pulmonary vascularity within normal limits. No pneumothorax. No adenopathy. There is degenerative change in each shoulder. No acute fractures are apparent.  IMPRESSION: No pneumothorax.  No edema or consolidation.    Electronically Signed   By: Bretta Bang III M.D.   On: 08/08/2014 10:55   Dg Pelvis 1-2 Views  08/08/2014   CLINICAL DATA:  Recent fall with hip pain  EXAM: PELVIS - 1-2 VIEW  COMPARISON:  08/02/2014  FINDINGS: There is no evidence of pelvic fracture or diastasis. No pelvic bone lesions are seen. Degenerative changes of the hip joints are noted bilaterally.  IMPRESSION: No acute abnormality is noted.  No change from the prior exam.   Electronically Signed   By: Alcide Clever M.D.   On: 08/08/2014 11:03   Ct Head Wo Contrast  08/08/2014   CLINICAL DATA:  Fall.  Wheezing.  EXAM: CT HEAD WITHOUT CONTRAST  CT CERVICAL SPINE WITHOUT CONTRAST  TECHNIQUE: Multidetector CT imaging of the head and cervical spine was performed following the standard protocol without intravenous contrast. Multiplanar CT image reconstructions of the cervical spine were also generated.  COMPARISON:  CT 08/02/2014  FINDINGS: CT HEAD FINDINGS  Moderate atrophy. Mild chronic microvascular ischemic change in the white matter.  Negative for acute infarct.  Negative for hemorrhage or mass lesion.  Calvarium intact. Marked thickening of the transverse ligament of C1 with erosion of the odontoid. Question rheumatoid arthritis or CPPD.  CT CERVICAL SPINE FINDINGS  Solid interbody bony fusion of C2 through C6 unchanged from the prior study. Posterior element fusion also present in the upper and mid cervical spine. Advanced disc degeneration and spondylosis at C6-7 with spinal and foraminal encroachment. Fusion at T1-T2 noted.  Advanced arthropathy is C1-C2 with erosion of the posterior dens, sclerotic changes of the dens, and thickening and calcification of the transverse ligament. This is chronic and unchanged and may be related to CPPD or rheumatoid arthritis or osteoarthritis.  Negative for fracture.  IMPRESSION: No acute intracranial abnormality.  Surgical fusion of C2 through C6. Advanced cervical degenerative change without fracture.    Electronically Signed   By: Marlan Palau M.D.   On: 08/08/2014 10:18   Ct Cervical Spine Wo Contrast  08/08/2014   CLINICAL DATA:  Fall.  Wheezing.  EXAM: CT HEAD WITHOUT CONTRAST  CT CERVICAL SPINE WITHOUT CONTRAST  TECHNIQUE: Multidetector CT imaging of the head  and cervical spine was performed following the standard protocol without intravenous contrast. Multiplanar CT image reconstructions of the cervical spine were also generated.  COMPARISON:  CT 08/02/2014  FINDINGS: CT HEAD FINDINGS  Moderate atrophy. Mild chronic microvascular ischemic change in the white matter.  Negative for acute infarct.  Negative for hemorrhage or mass lesion.  Calvarium intact. Marked thickening of the transverse ligament of C1 with erosion of the odontoid. Question rheumatoid arthritis or CPPD.  CT CERVICAL SPINE FINDINGS  Solid interbody bony fusion of C2 through C6 unchanged from the prior study. Posterior element fusion also present in the upper and mid cervical spine. Advanced disc degeneration and spondylosis at C6-7 with spinal and foraminal encroachment. Fusion at T1-T2 noted.  Advanced arthropathy is C1-C2 with erosion of the posterior dens, sclerotic changes of the dens, and thickening and calcification of the transverse ligament. This is chronic and unchanged and may be related to CPPD or rheumatoid arthritis or osteoarthritis.  Negative for fracture.  IMPRESSION: No acute intracranial abnormality.  Surgical fusion of C2 through C6. Advanced cervical degenerative change without fracture.   Electronically Signed   By: Marlan Palauharles  Clark M.D.   On: 08/08/2014 10:18     EKG Interpretation None      MDM   Final diagnoses:  Fall  UTI (lower urinary tract infection)   Level V caveat secondary to dementia. 80 year old female with unwitnessed fall at SNF. Of note, this is patient's 6th ED visit so far this year for recurrent falls.  Nursing staff at SNF [facility](Crystal) states patient has baseline gait abnormality which she  thinks is why patient continues falling.  No recent med changes.  No fevers or illness.  Patient appears at her baseline mental status.  Given mildly low CBG en route, will obtain basic labs and imaging.  Imaging negative for acute injuries.  Lab work reassuring.  U/a appears infectious, will send for culture.  Patient's daughter has arrived, states she feels that she is not getting appropriate care at [facility] and has concern over her recurrent falls.  Social work was consulted and has spoken with daughter about alternative placement.  Today no emergent conditions identified requiring hospital admission.  Patient will be treated with keflex for UTI pending urine culture.  Daughter will speak with social worker at facility to determine if transfer possible.  Patient transferred back to facility via [facility].  Discussed plan with patient, he/she acknowledged understanding and agreed with plan of care.  Return precautions given for new or worsening symptoms.  Case discussed with attending physician, Dr. Bebe ShaggyWickline, who evaluated patient and agrees with assessment and plan of care.  Garlon HatchetLisa M Eduarda Scrivens, PA-C 08/08/14 1401  Joya Gaskinsonald W Wickline, MD 08/09/14 [phone]

## 2014-08-08 NOTE — ED Notes (Signed)
C-COLLAR removed by EMT per verbal order of PA Misty StanleyLisa
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
## 2014-08-08 NOTE — ED Notes (Signed)
CBC clotted per lab. Order placed for redraw.

## 2014-08-08 NOTE — ED Provider Notes (Signed)
Patient seen/examined in the Emergency Department in conjunction with Midlevel Provider Allyne GeeSanders Patient presents s/p fall Exam : awake/alert, no distress, able to range lower extremities without obvious pain Plan: pt with h/o dementia and frequent falls in facility Will request case management consult as patient may need upgrade in facility    Tanya Gaskinsonald W Jaelyn Cloninger, MD 08/08/14 1155

## 2014-08-08 NOTE — ED Notes (Signed)
Pt sent [facility] on [address].  Pt was being driven here by [facility] for unwitnessed and her cbg was 68 so she was transferred to a [facility] truck after being given 1 mg IM glucagon (R thigh).  CBG for [facility] was 101.  Per EMS, pt is her norm for mental status.

## 2014-08-08 NOTE — ED Notes (Signed)
Social work at bedside.  
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
## 2014-08-08 NOTE — Discharge Instructions (Signed)
Take the prescribed medication as directed. °Follow-up with your primary care physician. °Return to the ED for new or worsening symptoms. ° °

## 2014-08-11 LAB — URINE CULTURE

## 2014-08-12 ENCOUNTER — Telehealth (HOSPITAL_COMMUNITY): Payer: Self-pay

## 2014-08-12 NOTE — ED Notes (Signed)
Post ED Visit - Positive Culture Follow-up  Culture report reviewed by antimicrobial stewardship pharmacist: []  Wes Dulaney, Pharm.D., BCPS [x]  Celedonio MiyamotoJeremy Frens, Pharm.D., BCPS []  Georgina PillionElizabeth Martin, [address] BCPS []  AltamontMinh Pham, [address]Ph[address]D., BCPS, AAHIVP []  Estella HuskMichelle Turner, Pharm.D., BCPS, AAHIVP []  Elder CyphersLorie Poole, [address]Ph[address]D., BCPS  Positive urine culture Treated with cephalexin, organism sensitive to the same and no further patient follow-up is required at this time.  Ashley JacobsFesterman, Itzy Adler C 08/12/2014, 1:49 PM

## 2014-09-23 ENCOUNTER — Non-Acute Institutional Stay (SKILLED_NURSING_FACILITY): Payer: Medicare Other | Admitting: Internal Medicine

## 2014-09-23 DIAGNOSIS — R55 Syncope and collapse: Secondary | ICD-10-CM

## 2014-09-23 DIAGNOSIS — G2119 Other drug induced secondary parkinsonism: Secondary | ICD-10-CM

## 2014-09-23 DIAGNOSIS — F0280 Dementia in other diseases classified elsewhere without behavioral disturbance: Secondary | ICD-10-CM

## 2014-09-23 DIAGNOSIS — G309 Alzheimer's disease, unspecified: Secondary | ICD-10-CM | POA: Diagnosis not present

## 2014-09-23 DIAGNOSIS — F29 Unspecified psychosis not due to a substance or known physiological condition: Secondary | ICD-10-CM

## 2014-09-24 LAB — LIPID PANEL
Cholesterol: 162 mg/dL (ref 0–200)
HDL: 54 mg/dL (ref 35–70)
LDL CALC: 99 mg/dL
TRIGLYCERIDES: 44 mg/dL (ref 40–160)

## 2014-09-24 LAB — CBC AND DIFFERENTIAL
HEMATOCRIT: 33 % — AB (ref 36–46)
Hemoglobin: 10.5 g/dL — AB (ref 12.0–16.0)
PLATELETS: 214 10*3/uL (ref 150–399)
WBC: 4.8 10^3/mL

## 2014-09-27 NOTE — Progress Notes (Addendum)
Patient ID: [name], female   DOB: 02/05/1935, 80 y.o.   MRN: [id number]                 HISTORY & PHYSICAL  DATE:  09/23/2014            [facility]                       LEVEL OF CARE:   SNF   CHIEF COMPLAINT:  Admission to SNF, post transfer from an assisted living, I believe.  I believe she is here for permanent placement.    HISTORY OF PRESENT ILLNESS:  This is a lady who appears to have been transferred here, presumably due to the consequences of advanced Alzheimer's disease.    By review of [facility], she has had several trips to the emergency room with falls as a predominant problem.  I count eight visits to the emergency room since August 2014.  Some of these seem to be associated with a UTI.  This last UTI visit was on 08/08/2014.  Lab work at that time showed a normal basic metabolic panel.  Her white count was 12.5, hemoglobin 11.2, platelet count 168, 82% neutrophils.  Urine culture at that time grew E.coli.    PAST MEDICAL HISTORY/PROBLEM LIST:         Alzheimer's disease with a history of psychosis/hallucinations.    Hypertension.    Syncope.    History of bradycardia.    Gait abnormalities.     CURRENT MEDICATIONS:  Medication list is reviewed.            Xanax 0.25 b.i.d.      Bumetanide 0.5 mg q.d.      Depakote Sprinkles 250 in the morning and 125 at h.s.    Hydrocodone 5/325, 1 p.o. q.12 hours.    Risperdal 1 mg/mL, 0.5 mL q.h.s.        Exelon 3 mg b.i.d.       Vitamin D2, 30,000 U weekly.    Alprazolam 0.25 q.6 p.r.n.         TED hose, on during the day and off at night.    SOCIAL HISTORY:                   TOBACCO:  She is a never-smoker.   ALCOHOL USE:  No alcohol use.    ADVANCED DIRECTIVES:  She does not come here with any advanced directives.    FAMILY HISTORY:   History of hypertension.      REVIEW OF SYSTEMS:    Not possible in this patient secondary to the advanced state of her dementia.     PHYSICAL  EXAMINATION:   GENERAL APPEARANCE:  The patient is not in any distress.    She occasionally answers questions with one-word answers.  However, she is not verbal.   HEENT:   MOUTH/THROAT:     CHEST/RESPIRATORY:  Clear air entry bilaterally.     CARDIOVASCULAR:   CARDIAC:  Heart sounds are normal.  There are no murmurs.  No gallops.    GASTROINTESTINAL:   LIVER/SPLEEN/KIDNEY:   No liver, no spleen.  No tenderness.    GENITOURINARY:   BLADDER:  Not distended.  There is no CVA tenderness.   VASCULAR:   ARTERIAL:  Extremities:   Peripheral pulses are absent below the femoral.   SKIN:   INSPECTION:  There are no wounds evident.   MUSCULOSKELETAL:  EXTREMITIES:   RIGHT LOWER EXTREMITY:  She has had a right total knee replacement.    NEUROLOGICAL:   She appears to be bradykinetic.  Has rigidity in her lower extremities.  There is probably a flexion contracture.   There is no cogwheeling.    ASSESSMENT/PLAN:                    Alzheimer's disease with a history of psychosis.   She is on an aggressive medical regimen for this including Xanax, Depakote, Risperdal, Exelon.    Bradycardia with a history of syncope.  Her pulse rate today is 70.  I do not have much more information on this.  She was admitted to hospital in August 2014 with syncope, her eyes rolled back in her head, episode of blank staring.  At that time, she was found to be bradycardic with a heart rate in the 40s.  Her EKG was normal.  It was felt secondary to a beta blocker.  Cardiac enzymes were normal.    History of multiple falls.   Probably secondary to the advanced state of her dementia, parkinsonism.  She also apparently carries a diagnosis of cervical spondylosis/myelopathy.  At this point, she is essentially nonambulatory and a two-person transfer.  Hopefully, she is beyond the state where this is going to be a recurrent issue.    Parkinsonism.  She is bradykinetic and rigid in the lower extremities.  There is no  cogwheeling and no tremor.  This could be secondary to the advanced state of her Alzheimer's disease or, for that matter, any dementia.  I would also wonder about drug-induced from her Risperdal.  Consider tapering the Risperdal, hopefully to off.    On a high dose of vitamin D2 weekly.  I will check a 25-hydroxy vitamin D level on her.  This may be able to be reduced.    This is a patient with severe dementia.  Lab work will be checked, along with a vitamin D level.  I am going to attempt to taper the Risperdal.

## 2014-09-29 LAB — BASIC METABOLIC PANEL
BUN: 21 mg/dL (ref 4–21)
Creatinine: 0.8 mg/dL (ref <=1.1)
GLUCOSE: 83 mg/dL
Sodium: 145 mmol/L (ref 137–147)

## 2014-09-29 LAB — HEPATIC FUNCTION PANEL
ALK PHOS: 66 U/L (ref 25–125)
ALT: 11 U/L (ref 7–35)
AST: 16 U/L (ref 13–35)
BILIRUBIN, TOTAL: 0.3 mg/dL

## 2014-10-22 ENCOUNTER — Other Ambulatory Visit: Payer: Self-pay

## 2014-10-22 MED ORDER — HYDROCODONE-ACETAMINOPHEN 5-325 MG PO TABS: 1.0000 | ORAL_TABLET | Freq: Two times a day (BID) | ORAL | Status: DC

## 2014-10-22 NOTE — Telephone Encounter (Signed)
Rx faxed to [facility] @ [phone], phone number [phone]  

## 2014-11-03 ENCOUNTER — Non-Acute Institutional Stay (SKILLED_NURSING_FACILITY): Payer: Medicare Other | Admitting: Nurse Practitioner

## 2014-11-03 DIAGNOSIS — E559 Vitamin D deficiency, unspecified: Secondary | ICD-10-CM

## 2014-11-03 DIAGNOSIS — F0391 Unspecified dementia with behavioral disturbance: Secondary | ICD-10-CM

## 2014-11-03 DIAGNOSIS — I10 Essential (primary) hypertension: Secondary | ICD-10-CM | POA: Diagnosis not present

## 2014-11-03 NOTE — Progress Notes (Signed)
Patient ID: [name], female   DOB: December 27, 1934, 80 y.o.   MRN: [id number]    Nursing Home Location:  [facility]   Place of Service: SNF (31)  PCP: Ron Parker, MD  No Known Allergies  Chief Complaint  Patient presents with  . Medical Management of Chronic Issues    HPI:  Patient is a 80 y.o. female seen today at [facility] for routine follow up. Pt was admitted to [facility] for long term care. Pt with advanced dementia. Staff providing total care and assisting with meals. Good intake per nursing. Reports increased agitation at times.  No acute concerns noted or voiced by staff.   Review of Systems:  Review of Systems  Unable to perform ROS: Dementia    Past Medical History  Diagnosis Date  . Hallucinations   . Abnormality of gait   . Spondylosis, cervical, with myelopathy   . Dementia   . Hypertension   . Chronic kidney disease     kidney stones  . Cervical spondylosis   . Hallucinations    Past Surgical History  Procedure Laterality Date  . Lumbar laminectomy    . Knee arthroscopy    . C-spine     Social History:   reports that she has never smoked. She has never used smokeless tobacco. She reports that she does not drink alcohol or use illicit drugs.  Family History  Problem Relation Age of Onset  . Hypertension Mother   . Hypertension Father     Medications: Patient's Medications  New Prescriptions   No medications on file  Previous Medications   ACETAMINOPHEN (TYLENOL) 500 MG TABLET    Take 500 mg by mouth every 4 (four) hours as needed for mild pain, fever or headache.    ALPRAZOLAM (XANAX) 0.25 MG TABLET    Take 0.25 mg by mouth 2 (two) times daily.    ALPRAZOLAM (XANAX) 0.25 MG TABLET    Take 0.25 mg by mouth every 6 (six) hours as needed for anxiety.   ALUM & MAG HYDROXIDE-SIMETH (GERI-LANTA PO)    Take 30 mLs by mouth 4 (four) times daily as needed (heartburn/indigestion).   AZELASTINE (OPTIVAR) 0.05 %  OPHTHALMIC SOLUTION    Place 1 drop into both eyes 2 (two) times daily.   BUMETANIDE (BUMEX) 0.5 MG TABLET    Take 0.5 mg by mouth daily.   DIVALPROEX (DEPAKOTE) 125 MG DR TABLET    Take 250 mg by mouth 2 (two) times daily.    GUAIFENESIN (ROBITUSSIN) 100 MG/5ML SOLN    Take 10 mLs by mouth every 6 (six) hours as needed for cough.    HYDROCODONE-ACETAMINOPHEN (NORCO/VICODIN) 5-325 MG PER TABLET    Take 1 tablet by mouth every 12 (twelve) hours. DNE4G   LOPERAMIDE (IMODIUM A-D) 2 MG TABLET    Take 2 mg by mouth 4 (four) times daily as needed for diarrhea or loose stools.   MAGNESIUM HYDROXIDE (MILK OF MAGNESIA) 400 MG/5ML SUSPENSION    Take 30 mLs by mouth at bedtime as needed for mild constipation.    MENTHOL-ZINC OXIDE (RISAMINE EX)    Place 1 application into the perineum as needed (incontinence). Apply with each diaper change.   NEOMYCIN-BACITRACIN-POLYMYXIN (NEOSPORIN) [phone] OINTMENT    Apply 1 application topically as needed (Skin tears, abrasions, minor irritation). Apply after cleaning with normal saline.   NUTRITIONAL SUPPLEMENTS (NUTRITIONAL SHAKE PO)    Take 1 Can by mouth 3 (three) times daily with meals.  Mighty shakes.   VITAMIN D, ERGOCALCIFEROL, (DRISDOL) 50000 UNITS CAPS CAPSULE    Take 50,000 Units by mouth every 7 (seven) days. ON THURSDAYS  Modified Medications   No medications on file  Discontinued Medications   CEPHALEXIN (KEFLEX) 500 MG CAPSULE    Take 1 capsule (500 mg total) by mouth 3 (three) times daily.   RISPERIDONE (RISPERDAL) 1 MG/ML ORAL SOLUTION    Take 0.5 mg by mouth daily. 8 pm.     Physical Exam: Filed Vitals:   11/03/14 1040  BP: 128/70  Pulse: 80  Temp: 97.8 F (36.6 C)  Resp: 18  Weight: 141 lb (63.957 kg)    Physical Exam  Constitutional: She appears well-developed. No distress.  HENT:  Head: Normocephalic and atraumatic.  Mouth/Throat: Oropharynx is clear and moist. No oropharyngeal exudate.  Eyes: Conjunctivae are normal. Pupils are  equal, round, and reactive to light.  Neck: Normal range of motion. Neck supple.  Cardiovascular: Normal rate, regular rhythm and normal heart sounds.   Pulmonary/Chest: Effort normal and breath sounds normal.  Abdominal: Soft. Bowel sounds are normal.  Musculoskeletal: She exhibits no edema or tenderness.  Neurological: She is alert.  Skin: Skin is warm and dry. She is not diaphoretic.  Psychiatric:  Advanced dementia    Labs reviewed: Basic Metabolic Panel:  Recent Labs  65/78/4601/05/28 0445 08/08/14 1122  NA 143 140  K 3.7 4.2  CL 105 105  CO2  --  27  GLUCOSE 94 86  BUN 19 17  CREATININE 0.80 0.73  CALCIUM  --  9.0   Liver Function Tests: No results for input(s): AST, ALT, ALKPHOS, BILITOT, PROT, ALBUMIN in the last 8760 hours. No results for input(s): LIPASE, AMYLASE in the last 8760 hours. No results for input(s): AMMONIA in the last 8760 hours. CBC:  Recent Labs  07/25/14 0445 08/08/14 1229  WBC  --  12.5*  NEUTROABS  --  10.3*  HGB 12.9 11.2*  HCT 38.0 35.1*  MCV  --  91.6  PLT  --  168   TSH: No results for input(s): TSH in the last 8760 hours. A1C: Lab Results  Component Value Date   HGBA1C  05/13/2010    5.5 (NOTE)                                                                       According to the ADA Clinical Practice Recommendations for 2011, when HbA1c is used as a screening test:   >=6.5%   Diagnostic of Diabetes Mellitus           (if abnormal result  is confirmed)  5.7-6.4%   Increased risk of developing Diabetes Mellitus  References:Diagnosis and Classification of Diabetes Mellitus,Diabetes Care,2011,34(Suppl 1):S62-S69 and Standards of Medical Care in         Diabetes - 2011,Diabetes Care,2011,34  (Suppl 1):S11-S61.   Lipid Panel: No results for input(s): CHOL, HDL, LDLCALC, TRIG, CHOLHDL, LDLDIRECT in the last 8760 hours.   Assessment/Plan 1. Dementia, with behavioral disturbance -advanced dementia, ongoing behaviors which have been  treated with Depakote. Following with psych services    2. Essential hypertension Not currently on medications, metoprolol was stopped due to bradycardia which has improved  3. Vitamin D deficiency -  VIt D level at 46, will stop the 50,000 units weekly and start vit d 2000 units daily

## 2014-11-24 ENCOUNTER — Other Ambulatory Visit: Payer: Self-pay | Admitting: *Deleted

## 2014-11-24 MED ORDER — HYDROCODONE-ACETAMINOPHEN 5-325 MG PO TABS: ORAL_TABLET | ORAL | Status: DC

## 2014-11-24 NOTE — Telephone Encounter (Signed)
[facility] 

## 2014-12-29 ENCOUNTER — Other Ambulatory Visit: Payer: Self-pay | Admitting: *Deleted

## 2014-12-29 MED ORDER — HYDROCODONE-ACETAMINOPHEN 5-325 MG PO TABS: ORAL_TABLET | ORAL | Status: DC

## 2014-12-29 NOTE — Telephone Encounter (Signed)
[facility] 

## 2015-01-06 ENCOUNTER — Non-Acute Institutional Stay (SKILLED_NURSING_FACILITY): Payer: Medicare Other | Admitting: Internal Medicine

## 2015-01-06 DIAGNOSIS — R634 Abnormal weight loss: Secondary | ICD-10-CM

## 2015-01-06 DIAGNOSIS — G2119 Other drug induced secondary parkinsonism: Secondary | ICD-10-CM

## 2015-01-14 NOTE — Progress Notes (Addendum)
Patient ID: [name], female   DOB: 1935-03-26, 80 y.o.   MRN: [id number]                PROGRESS NOTE  DATE:  01/06/2015           FACILITY: [facility]                    LEVEL OF CARE:   SNF   Routine Visit                 CHIEF COMPLAINT:  Routine visit to follow medical issues, including weight loss.      HISTORY OF PRESENT ILLNESS:  This is a lady who came here from an assisted living in April of this year.    She has a history of Alzheimer's disease with a suggestion of psychosis and hallucinations.    She has had multiple falls, by review of her emergency room visits.    When she came here, I thought she had very significant parkinsonism secondary to Risperdal.  I believe the Risperdal was discontinued.  This was in early May.    She has been treated by Psychiatry with Depakote Sprinkles.  I think this is as a mood stabilizer.    LABORATORY DATA:   In April 2016:    Hemoglobin 11.6.  Otherwise, a normal CBC.    Albumin was 3.  Otherwise, a normal comprehensive metabolic panel.     REVIEW OF SYSTEMS:   Really not possible from the patient.  However, I received a note that she has had a six-pound weight loss.      PHYSICAL EXAMINATION:   VITAL SIGNS:     TEMPERATURE:  97.8.   PULSE:  80.   RESPIRATIONS:  18.   BLOOD PRESSURE:  148/94.   WEIGHT:  Most recently 146 pounds.  Once again, I am not seeing the six-pound weight loss.  It seems to be really stable, looking back over the course of the year.   CHEST/RESPIRATORY:  Clear air entry bilaterally.    CARDIOVASCULAR:   CARDIAC:  Heart sounds are normal.  There are no murmurs.    GASTROINTESTINAL:   ABDOMEN:  No masses.    NEUROLOGICAL:   She continues to have markedly increased tone, especially in her legs.  She has a history of cervical and lumbar spinal stenosis.  I wonder if this has something to do with this.  She has no tremor.    ASSESSMENT/PLAN:                      Weight loss.  I am having  trouble seeing this in the records.  She has not had any recent lab work since April and I will update this.      Severe parkinsonism.   This could be an accompaniment of just about any advanced dementia.  I had really hoped that this would improve more off the Risperdal.  I will consider giving her a trial of Sinemet, although I would like to see her in bed to really go over her tone.

## 2015-01-26 ENCOUNTER — Other Ambulatory Visit: Payer: Self-pay | Admitting: *Deleted

## 2015-01-26 MED ORDER — HYDROCODONE-ACETAMINOPHEN 5-325 MG PO TABS: ORAL_TABLET | ORAL | Status: DC

## 2015-01-26 NOTE — Telephone Encounter (Signed)
[facility] 

## 2015-01-30 LAB — HEPATIC FUNCTION PANEL
ALT: 17 U/L (ref 7–35)
AST: 17 U/L (ref 13–35)
Alkaline Phosphatase: 67 U/L (ref 25–125)
BILIRUBIN, TOTAL: 0.3 mg/dL

## 2015-01-30 LAB — BASIC METABOLIC PANEL
BUN: 19 mg/dL (ref 4–21)
Creatinine: 0.8 mg/dL (ref 0.5–1.1)
GLUCOSE: 82 mg/dL
Potassium: 4 mmol/L (ref 3.4–5.3)
SODIUM: 143 mmol/L (ref 137–147)

## 2015-01-30 LAB — CBC AND DIFFERENTIAL
HEMATOCRIT: 38 % (ref 36–46)
HEMOGLOBIN: 11.8 g/dL — AB (ref 12.0–16.0)
Platelets: 178 10*3/uL (ref 150–399)
WBC: 3.6 10^3/mL

## 2015-02-03 ENCOUNTER — Non-Acute Institutional Stay (SKILLED_NURSING_FACILITY): Payer: Medicare Other | Admitting: Internal Medicine

## 2015-02-03 DIAGNOSIS — K645 Perianal venous thrombosis: Secondary | ICD-10-CM

## 2015-02-03 DIAGNOSIS — L89152 Pressure ulcer of sacral region, stage 2: Secondary | ICD-10-CM

## 2015-02-03 DIAGNOSIS — G2119 Other drug induced secondary parkinsonism: Secondary | ICD-10-CM

## 2015-02-03 DIAGNOSIS — F0391 Unspecified dementia with behavioral disturbance: Secondary | ICD-10-CM | POA: Diagnosis not present

## 2015-02-09 NOTE — Progress Notes (Addendum)
Patient ID: [name], female   DOB: [birth year], 80 y.o.   MRN: [id number]                PROGRESS NOTE  DATE:  02/03/2015          FACILITY: [facility]                      LEVEL OF CARE:   SNF   Acute Visit                   CHIEF COMPLAINT:  Coccyx ulcer, hemorrhoids.    HISTORY OF PRESENT ILLNESS:  This is a patient who came here in April 2016 from an assisted living.    She has a history of Alzheimer's disease with psychosis.      Other issues at the time included a history of bradycardia, history of syncope, multiple falls, and parkinsonism.    She has been followed by Psychiatry and put on Depakote.  Her Risperdal was stopped in early May due to parkinsonism.  I have not been made aware of any behavioral issues.    PAST MEDICAL HISTORY/PROBLEM LIST:          Alzheimer's disease with a history of psychosis and hallucinations.  I am not aware that this has been problematic.    CURRENT MEDICATIONS:  Medication list is reviewed.          Bumetanide 0.5 q.d.      Depakote Sprinkles 250 in the morning, 250 at h.s.     Hydrocodone 5/325, 1 p.o. q.12 hours.    Exelon 3 mg b.i.d.       Vitamin D2, 30,000 U weekly.     Xanax 0.25 q.6 p.r.n.        *Risperdal has been discontinued.    REVIEW OF SYSTEMS:   Not possible secondary to the advanced state of her dementia.     PHYSICAL EXAMINATION:   VITAL SIGNS:     PULSE:  72.     RESPIRATIONS:  17.   BLOOD PRESSURE:  106/64.    02 SATURATIONS:  95%.   WEIGHT:  145 pounds on 01/29/2015.   This has been generally declining with a weight loss of seven pounds since her arrival here.   GENERAL APPEARANCE:  The patient is not in any distress.  She occasionally answers with one-word answers.           CHEST/RESPIRATORY:  Clear air entry bilaterally.    CARDIOVASCULAR:   CARDIAC:  Heart sounds are normal.  She appears to be euvolemic.       GASTROINTESTINAL:   LIVER/SPLEEN/KIDNEYS:  No liver, no spleen.  No  tenderness.     RECTAL:  There are indeed multiple external hemorrhoids.  Some of them look to be somewhat inflamed.   GENITOURINARY:   BLADDER:  Not distended.  There is no CVA tenderness.   SKIN:   INSPECTION:  There is a small, linear coccyx ulcer stage II.  They are using a foam-based dressing on this.  I think, along with turning and pressure relief, this is satisfactory.    ASSESSMENT/PLAN:                     Alzheimer's disease with psychosis.  We were able to take her off the Risperdal that she was on at admission.  The increased tone in her legs has improved.    History of bradycardia with syncope.  I do not have any information on this.  Presumably, this has been stable.    Parkinsonism.  Much better off the Risperdal.    External hemorrhoids.  I will provide a topical agent.    Stage II coccyx ulcer.  Foam-based dressings are satisfactory.  This looks as though it is closing.

## 2015-02-17 ENCOUNTER — Other Ambulatory Visit: Payer: Self-pay | Admitting: *Deleted

## 2015-02-17 MED ORDER — ALPRAZOLAM 0.25 MG PO TABS: ORAL_TABLET | ORAL | Status: DC

## 2015-02-17 NOTE — Telephone Encounter (Signed)
[facility] 

## 2015-03-02 ENCOUNTER — Other Ambulatory Visit: Payer: Self-pay | Admitting: *Deleted

## 2015-03-02 MED ORDER — HYDROCODONE-ACETAMINOPHEN 5-325 MG PO TABS: ORAL_TABLET | ORAL | Status: DC

## 2015-03-02 NOTE — Telephone Encounter (Signed)
[facility] 

## 2015-03-02 NOTE — Telephone Encounter (Signed)
[facility] 

## 2015-03-23 ENCOUNTER — Other Ambulatory Visit: Payer: Self-pay | Admitting: *Deleted

## 2015-03-23 MED ORDER — ALPRAZOLAM 0.25 MG PO TABS: ORAL_TABLET | ORAL | Status: DC

## 2015-03-23 NOTE — Telephone Encounter (Signed)
[facility] 

## 2015-04-03 ENCOUNTER — Other Ambulatory Visit: Payer: Self-pay | Admitting: *Deleted

## 2015-04-03 MED ORDER — HYDROCODONE-ACETAMINOPHEN 5-325 MG PO TABS: ORAL_TABLET | ORAL | Status: DC

## 2015-04-03 NOTE — Telephone Encounter (Signed)
[facility] 

## 2015-07-02 ENCOUNTER — Other Ambulatory Visit: Payer: Self-pay | Admitting: *Deleted

## 2015-07-02 MED ORDER — HYDROCODONE-ACETAMINOPHEN 5-325 MG PO TABS: ORAL_TABLET | ORAL | Status: DC

## 2015-07-02 NOTE — Telephone Encounter (Signed)
[facility] 

## 2015-07-03 ENCOUNTER — Other Ambulatory Visit: Payer: Self-pay | Admitting: *Deleted

## 2015-07-03 MED ORDER — HYDROCODONE-ACETAMINOPHEN 5-325 MG PO TABS: ORAL_TABLET | ORAL | Status: DC

## 2015-07-03 NOTE — Telephone Encounter (Signed)
[facility] 

## 2015-07-27 ENCOUNTER — Non-Acute Institutional Stay (SKILLED_NURSING_FACILITY): Payer: Medicare Other | Admitting: Internal Medicine

## 2015-07-27 ENCOUNTER — Encounter: Payer: Self-pay | Admitting: Internal Medicine

## 2015-07-27 DIAGNOSIS — N182 Chronic kidney disease, stage 2 (mild): Secondary | ICD-10-CM | POA: Diagnosis not present

## 2015-07-27 DIAGNOSIS — F0391 Unspecified dementia with behavioral disturbance: Secondary | ICD-10-CM

## 2015-07-27 DIAGNOSIS — I10 Essential (primary) hypertension: Secondary | ICD-10-CM

## 2015-07-27 NOTE — Progress Notes (Signed)
[place] Room Number: 209 A  Place of Service:       No Known Allergies  Chief Complaint  Patient presents with  . Medical Management of Chronic Issues    HPI:  Routine medical visit for evaluation of chronic medical issues. Patient does not appear to have sustained any significant change in condition over the last few months.  Dementia, with behavioral disturbance - end-stage dementia unchanged from previous visits  Essential hypertension - controlled   Chronic kidney disease, stage 2 (mild) - needs follow-up lab work                          Medications: Patient's Medications  New Prescriptions   No medications on file  Previous Medications   ACETAMINOPHEN (TYLENOL) 500 MG TABLET    Take 500 mg by mouth every 4 (four) hours as needed for mild pain, fever or headache.    ALPRAZOLAM (XANAX) 0.25 MG TABLET    Take one tablet by mouth twice daily for anxiety   BUMETANIDE (BUMEX) 0.5 MG TABLET    Take 0.5 mg by mouth daily.   DIVALPROEX (DEPAKOTE) 125 MG DR TABLET    Take 250 mg by mouth 2 (two) times daily.    HYDROCODONE-ACETAMINOPHEN (NORCO/VICODIN) 5-325 MG TABLET    Take one tablet by mouth every 12 hours for pain. Do not exceed 4gm of Tylenol in 24hours   NUTRITIONAL SUPPLEMENTS (NUTRITIONAL SHAKE PO)    Take 1 Can by mouth 3 (three) times daily with meals. Mighty shakes.   PHENYLEPHRINE-SHARK LIVER OIL-MINERAL OIL-PETROLATUM (PREPARATION H) 0.25-3-14-71.9 % RECTAL OINTMENT    Place 1 application rectally 2 (two) times daily as needed for hemorrhoids.   RIVASTIGMINE (EXELON) 3 MG CAPSULE    Take 3 mg by mouth 2 (two) times daily.  Modified Medications   No medications on file  Discontinued Medications   ALPRAZOLAM (XANAX) 0.25 MG TABLET    Take 0.25 mg by mouth every 6 (six) hours as needed for anxiety.   ALUM & MAG HYDROXIDE-SIMETH (GERI-LANTA PO)    Take 30 mLs by mouth 4 (four) times daily as needed (heartburn/indigestion).    AZELASTINE (OPTIVAR) 0.05 % OPHTHALMIC SOLUTION    Place 1 drop into both eyes 2 (two) times daily.   GUAIFENESIN (ROBITUSSIN) 100 MG/5ML SOLN    Take 10 mLs by mouth every 6 (six) hours as needed for cough.    LOPERAMIDE (IMODIUM A-D) 2 MG TABLET    Take 2 mg by mouth 4 (four) times daily as needed for diarrhea or loose stools.   MAGNESIUM HYDROXIDE (MILK OF MAGNESIA) 400 MG/5ML SUSPENSION    Take 30 mLs by mouth at bedtime as needed for mild constipation.    MENTHOL-ZINC OXIDE (RISAMINE EX)    Place 1 application into the perineum as needed (incontinence). Apply with each diaper change.   NEOMYCIN-BACITRACIN-POLYMYXIN (NEOSPORIN) [phone] OINTMENT    Apply 1 application topically as needed (Skin tears, abrasions, minor irritation). Apply after cleaning with normal saline.   VITAMIN D, ERGOCALCIFEROL, (DRISDOL) 50000 UNITS CAPS CAPSULE    Take 50,000 Units by mouth every 7 (seven) days. ON THURSDAYS     Review of Systems  Constitutional: Negative for fever, chills, diaphoresis, activity change, appetite change, fatigue and unexpected weight change.  HENT: Negative for congestion, ear discharge, ear pain, hearing loss, postnasal drip, rhinorrhea, sore throat, tinnitus, trouble swallowing and voice change.   Eyes:  Negative for pain, redness, itching and visual disturbance.  Respiratory: Negative for cough, choking, shortness of breath and wheezing.   Cardiovascular: Negative for chest pain, palpitations and leg swelling.       History of bradycardia with syncope. Admitted to the hospital in 2014.  Gastrointestinal: Negative for nausea, abdominal pain, diarrhea, constipation and abdominal distention.  Endocrine: Negative for cold intolerance, heat intolerance, polydipsia, polyphagia and polyuria.  Genitourinary: Negative for dysuria, urgency, frequency, hematuria, flank pain, vaginal discharge, difficulty urinating and pelvic pain.  Musculoskeletal: Positive for gait problem. Negative for  myalgias, back pain, arthralgias, neck pain and neck stiffness.  Skin: Negative for color change, pallor and rash.  Allergic/Immunologic: Negative.   Neurological: Negative for dizziness, tremors, seizures, syncope, weakness, numbness and headaches.       End-stage dementia  Hematological: Negative for adenopathy. Does not bruise/bleed easily.  Psychiatric/Behavioral: Negative for suicidal ideas, hallucinations, behavioral problems, confusion, sleep disturbance, dysphoric mood and agitation. The patient is not nervous/anxious and is not hyperactive.        History of hallucinations    Filed Vitals:   07/27/15 1346  BP: 140/60  Pulse: 80  Temp: 98.2 F (36.8 C)  Resp: 20   Wt Readings from Last 3 Encounters:  11/03/14 141 lb (63.957 kg)  08/02/14 140 lb (63.504 kg)  01/30/14 140 lb (63.504 kg)    There is no weight on file to calculate BMI.  Physical Exam  Constitutional: She is oriented to person, place, and time. She appears well-developed and well-nourished. No distress.  HENT:  Head: Normocephalic and atraumatic.  Right Ear: External ear normal.  Left Ear: External ear normal.  Nose: Nose normal.  Mouth/Throat: Oropharynx is clear and moist. No oropharyngeal exudate.  Eyes: Conjunctivae and EOM are normal. Pupils are equal, round, and reactive to light. No scleral icterus.  Neck: Normal range of motion. Neck supple. No JVD present. No tracheal deviation present. No thyromegaly present.  Cardiovascular: Normal rate, regular rhythm, normal heart sounds and intact distal pulses.  Exam reveals no gallop and no friction rub.   No murmur heard. Pulmonary/Chest: Effort normal and breath sounds normal. No respiratory distress. She has no wheezes. She has no rales. She exhibits no tenderness.  Abdominal: Soft. Bowel sounds are normal. She exhibits no distension and no mass. There is no tenderness.  Musculoskeletal: Normal range of motion. She exhibits no edema or tenderness.    Lymphadenopathy:    She has no cervical adenopathy.  Neurological: She is alert and oriented to person, place, and time. No cranial nerve deficit. Coordination normal.  End-stage dementia  Skin: Skin is warm and dry. No rash noted. She is not diaphoretic. No erythema. No pallor.  Psychiatric: Her behavior is normal.  Drowsy     Labs reviewed: Lab Summary Latest Ref Rng 09/29/2014 09/24/2014 08/08/2014 07/31/2014 07/25/2014 02/10/2013  Hemoglobin 12.0 - 16.0 g/dL (None) 10.5(A) 11.2(L) (None) 12.9 11.9(L)  Hematocrit 36 - 46 % (None) 33(A) 35.1(L) (None) 38.0 35.8(L)  White count - (None) 4.8 12.5(H) (None) (None) 5.8  Platelet count 150 - 399 K/L (None) 214 168 (None) (None) 148(L)  Sodium 137 - 147 mmol/L 145 (None) 140 (None) 143 140  Potassium 3.5 - 5.1 mmol/L (None) (None) 4.2 4.0 3.7 3.4(L)  Calcium 8.4 - 10.5 mg/dL (None) (None) 9.0 (None) (None) 9.4  Phosphorus - (None) (None) (None) (None) (None) (None)  Creatinine .5 - 1.1 mg/dL 0.8 (None) 0.73 (None) 0.80 0.84  AST 13 - 35 U/L 16 (  None) (None) (None) (None) (None)  Alk Phos 25 - 125 U/L 66 (None) (None) (None) (None) (None)  Bilirubin - (None) (None) (None) (None) (None) (None)  Glucose - 83 (None) 86 (None) 94 95  Cholesterol 0 - 200 mg/dL (None) 162 (None) (None) (None) (None)  HDL cholesterol 35 - 70 mg/dL (None) 54 (None) (None) (None) (None)  Triglycerides 40 - 160 mg/dL (None) 44 (None) (None) (None) (None)  LDL Direct - (None) (None) (None) (None) (None) (None)  LDL Calc - (None) 99 (None) (None) (None) (None)  Total protein - (None) (None) (None) (None) (None) (None)  Albumin - (None) (None) (None) (None) (None) (None)   Lab Results  Component Value Date   TSH 2.021 02/09/2013   Lab Results  Component Value Date   BUN 21 09/29/2014   BUN 17 08/08/2014   BUN 19 07/25/2014   Lab Results  Component Value Date   CREATININE 0.8 09/29/2014   CREATININE 0.73 08/08/2014   CREATININE 0.80 07/25/2014   Lab  Results  Component Value Date   HGBA1C  05/13/2010    5.5 (NOTE)                                                                       According to the ADA Clinical Practice Recommendations for 2011, when HbA1c is used as a screening test:   >=6.5%   Diagnostic of Diabetes Mellitus           (if abnormal result  is confirmed)  5.7-6.4%   Increased risk of developing Diabetes Mellitus  References:Diagnosis and Classification of Diabetes Mellitus,Diabetes Care,2011,34(Suppl 1):S62-S69 and Standards of Medical Care in         Diabetes - 2011,Diabetes Care,2011,34  (Suppl 1):S11-S61.       Assessment/Plan  1. Dementia, with behavioral disturbance Currently on Exelon  2. Essential hypertension Off all medication  3. Chronic kidney disease, stage 2 (mild) -CMP

## 2015-07-29 DIAGNOSIS — N189 Chronic kidney disease, unspecified: Secondary | ICD-10-CM | POA: Insufficient documentation

## 2015-08-03 ENCOUNTER — Other Ambulatory Visit: Payer: Self-pay | Admitting: *Deleted

## 2015-08-03 MED ORDER — HYDROCODONE-ACETAMINOPHEN 5-325 MG PO TABS: ORAL_TABLET | ORAL | Status: DC

## 2015-08-03 NOTE — Telephone Encounter (Signed)
[facility] 

## 2015-09-02 ENCOUNTER — Non-Acute Institutional Stay (SKILLED_NURSING_FACILITY): Payer: Medicare Other | Admitting: Nurse Practitioner

## 2015-09-02 ENCOUNTER — Encounter: Payer: Self-pay | Admitting: Nurse Practitioner

## 2015-09-02 DIAGNOSIS — F419 Anxiety disorder, unspecified: Principal | ICD-10-CM

## 2015-09-02 DIAGNOSIS — F0390 Unspecified dementia without behavioral disturbance: Secondary | ICD-10-CM

## 2015-09-02 DIAGNOSIS — M150 Primary generalized (osteo)arthritis: Secondary | ICD-10-CM | POA: Diagnosis not present

## 2015-09-02 DIAGNOSIS — F418 Other specified anxiety disorders: Secondary | ICD-10-CM

## 2015-09-02 DIAGNOSIS — F329 Major depressive disorder, single episode, unspecified: Secondary | ICD-10-CM

## 2015-09-02 DIAGNOSIS — I10 Essential (primary) hypertension: Secondary | ICD-10-CM

## 2015-09-02 DIAGNOSIS — M159 Polyosteoarthritis, unspecified: Secondary | ICD-10-CM | POA: Insufficient documentation

## 2015-09-02 DIAGNOSIS — F32A Depression, unspecified: Secondary | ICD-10-CM | POA: Insufficient documentation

## 2015-09-02 NOTE — Assessment & Plan Note (Addendum)
Her mood is stabilized, continue Depakote 250mg  bid, Alprazolam 0.25mg  bid. Update CBC and TSH

## 2015-09-02 NOTE — Assessment & Plan Note (Signed)
Blood pressure is controlled, continue Bumex 0.5mg  daily, update CMP

## 2015-09-02 NOTE — Assessment & Plan Note (Signed)
Pain is well managed, continue Oxycodone 5mg  q12hrs.

## 2015-09-02 NOTE — Progress Notes (Signed)
Patient ID: [name], female   DOB: 08-02-1934, 81 y.o.   MRN: [id number]  Location:    [facility] Room Number: 209 A Place of Service:  SNF (31) Provider: Arna SnipeManXie Prayan Ulin NP  Ron ParkerBOWEN,SAMUEL, MD  Patient Care Team: Ron ParkerSamuel Bowen, MD as PCP - General (Internal Medicine)  Extended Emergency Contact Information Primary Emergency Contact: Bradser,Annette Address: [address]          [place], [place] [address] [place] Home Phone: [phone] Relation: Daughter Secondary Emergency Contact: Blattner,Clarence Address: [address]          [place], [place] [address] [place] Home Phone: [phone] Mobile Phone: [phone] Relation: Spouse  Code Status:  DNR Goals of care: Advanced Directive information Advanced Directives 09/02/2015  Does patient have an advance directive? No  Type of Advance Directive -  Does patient want to make changes to advanced directive? No - Patient declined  Copy of advanced directive(s) in chart? -     Chief Complaint  Patient presents with  . Medical Management of Chronic Issues    Routine visit    HPI:  Pt is a 81 y.o. female seen today for medical management of chronic diseases.  Hx of dementia, takes Exelon 3mg  bid for memory, her mood is stabilized whiel on Depakote 250mg  bid and Alprazolam 0.25mg  bid. Minimal edema seen while on Bumex 0.5mg  qd. Chronic pain is managed with Norco q12hr routinely.    Past Medical History  Diagnosis Date  . Hallucinations   . Abnormality of gait   . Spondylosis, cervical, with myelopathy   . Dementia   . Hypertension   . Chronic kidney disease     kidney stones  . Cervical spondylosis   . Hallucinations    Past Surgical History  Procedure Laterality Date  . Lumbar laminectomy    . Knee arthroscopy    . C-spine      No Known Allergies    Medication List       This list is accurate as of: 09/02/15  3:28 PM.  Always use your most recent med list.               ALPRAZolam 0.25 MG tablet  Commonly known as:  XANAX  Take one tablet by mouth twice daily for anxiety     bumetanide 0.5 MG tablet  Commonly known as:  BUMEX  Take 0.5 mg by mouth daily.     divalproex 125 MG DR tablet  Commonly known as:  DEPAKOTE  Take 250 mg by mouth 2 (two) times daily.     EXELON 3 MG capsule  Generic drug:  rivastigmine  Take 3 mg by mouth 2 (two) times daily.     HYDROcodone-acetaminophen 5-325 MG tablet  Commonly known as:  NORCO/VICODIN  Take one tablet by mouth every 12 hours for pain. Do not exceed 4gm of Tylenol in 24hours     phenylephrine-shark liver oil-mineral oil-petrolatum 0.25-3-14-71.9 % rectal ointment  Commonly known as:  PREPARATION H  Place 1 application rectally 2 (two) times daily as needed for hemorrhoids.     Vitamin D3 2000 units Tabs  Take 1 tablet by mouth daily.        Review of Systems  Constitutional: Negative for fever, chills, diaphoresis, activity change, appetite change, fatigue and unexpected weight change.  HENT: Negative for congestion, ear discharge, ear pain, hearing loss, postnasal drip, rhinorrhea, sore throat, tinnitus, trouble swallowing and voice change.   Eyes: Negative  for pain, redness, itching and visual disturbance.  Respiratory: Negative for cough, choking, shortness of breath and wheezing.   Cardiovascular: Negative for chest pain, palpitations and leg swelling.       History of bradycardia with syncope. Admitted to the hospital in 2014.  Gastrointestinal: Negative for nausea, abdominal pain, diarrhea, constipation and abdominal distention.  Endocrine: Negative for cold intolerance, heat intolerance, polydipsia, polyphagia and polyuria.  Genitourinary: Negative for dysuria, urgency, frequency, hematuria, flank pain, vaginal discharge, difficulty urinating and pelvic pain.  Musculoskeletal: Positive for gait problem. Negative for myalgias, back pain, arthralgias, neck pain and neck stiffness.  Skin:  Negative for color change, pallor and rash.  Allergic/Immunologic: Negative.   Neurological: Negative for dizziness, tremors, seizures, syncope, weakness, numbness and headaches.       End-stage dementia  Hematological: Negative for adenopathy. Does not bruise/bleed easily.  Psychiatric/Behavioral: Negative for suicidal ideas, hallucinations, behavioral problems, confusion, sleep disturbance, dysphoric mood and agitation. The patient is not nervous/anxious and is not hyperactive.        History of hallucinations    Immunization History  Administered Date(s) Administered  . Influenza-Unspecified 03/20/2015   Pertinent  Health Maintenance Due  Topic Date Due  . DEXA SCAN  04/04/2002  . PNA vac Low Risk Adult (1 of 2 - PCV13) 04/04/2002  . INFLUENZA VACCINE  01/12/2016   No flowsheet data found. Functional Status Survey:    Filed Vitals:   09/02/15 1116  BP: 175/56  Pulse: 63  Temp: 97.9 F (36.6 C)  TempSrc: Oral  Resp: 17  Height: 5' (1.524 m)  Weight: 154 lb (69.854 kg)   Body mass index is 30.08 kg/(m^2). Physical Exam  Constitutional: She is oriented to person, place, and time. She appears well-developed and well-nourished. No distress.  HENT:  Head: Normocephalic and atraumatic.  Right Ear: External ear normal.  Left Ear: External ear normal.  Nose: Nose normal.  Mouth/Throat: Oropharynx is clear and moist. No oropharyngeal exudate.  Eyes: Conjunctivae and EOM are normal. Pupils are equal, round, and reactive to light. No scleral icterus.  Neck: Normal range of motion. Neck supple. No JVD present. No tracheal deviation present. No thyromegaly present.  Cardiovascular: Normal rate, regular rhythm, normal heart sounds and intact distal pulses.  Exam reveals no gallop and no friction rub.   No murmur heard. Pulmonary/Chest: Effort normal and breath sounds normal. No respiratory distress. She has no wheezes. She has no rales. She exhibits no tenderness.  Abdominal:  Soft. Bowel sounds are normal. She exhibits no distension and no mass. There is no tenderness.  Musculoskeletal: Normal range of motion. She exhibits no edema or tenderness.  Lymphadenopathy:    She has no cervical adenopathy.  Neurological: She is alert and oriented to person, place, and time. No cranial nerve deficit. Coordination normal.  End-stage dementia  Skin: Skin is warm and dry. No rash noted. She is not diaphoretic. No erythema. No pallor.  Psychiatric: Her behavior is normal.  Drowsy    Labs reviewed:  Recent Labs  09/29/14  NA 145  BUN 21  CREATININE 0.8    Recent Labs  09/29/14  AST 16  ALT 11  ALKPHOS 66    Recent Labs  09/24/14  WBC 4.8  HGB 10.5*  HCT 33*  PLT 214   Lab Results  Component Value Date   TSH 2.021 02/09/2013   Lab Results  Component Value Date   HGBA1C  05/13/2010    5.5 (NOTE)  According to the ADA Clinical Practice Recommendations for 2011, when HbA1c is used as a screening test:   >=6.5%   Diagnostic of Diabetes Mellitus           (if abnormal result  is confirmed)  5.7-6.4%   Increased risk of developing Diabetes Mellitus  References:Diagnosis and Classification of Diabetes Mellitus,Diabetes Care,2011,34(Suppl 1):S62-S69 and Standards of Medical Care in         Diabetes - 2011,Diabetes Care,2011,34  (Suppl 1):S11-S61.   Lab Results  Component Value Date   CHOL 162 09/24/2014   HDL 54 09/24/2014   LDLCALC 99 09/24/2014   TRIG 44 09/24/2014   CHOLHDL 3.2 05/11/2010    Significant Diagnostic Results in last 30 days:  No results found.  Assessment/Plan  Dementia Resides in SNF for care needs. Taking Exelon  bid for memory.   Hypertension Blood pressure is controlled, continue Bumex 0.5mg  daily, update CMP  Anxiety and depression Her mood is stabilized, continue Depakote  bid, Alprazolam 0.25mg  bid. Update CBC and TSH  Osteoarthritis, multiple  sites Pain is well managed, continue Oxycodone  q12hrs.     Family/ staff Communication: continue SNF for care needs.   Labs/tests ordered: CBC, CMP, TSH

## 2015-09-02 NOTE — Assessment & Plan Note (Signed)
Resides in SNF for care needs. Taking Exelon 3mg  bid for memory.

## 2015-09-07 ENCOUNTER — Other Ambulatory Visit: Payer: Self-pay

## 2015-09-07 LAB — BASIC METABOLIC PANEL
BUN: 16 mg/dL (ref 4–21)
Creatinine: 0.8 mg/dL (ref <=1.1)
Glucose: 80 mg/dL
Potassium: 4 mmol/L (ref 3.4–5.3)
Sodium: 144 mmol/L (ref 137–147)

## 2015-09-07 LAB — TSH: TSH: 3.32 u[IU]/mL (ref <=5.90)

## 2015-09-07 MED ORDER — HYDROCODONE-ACETAMINOPHEN 5-325 MG PO TABS: ORAL_TABLET | ORAL | Status: DC

## 2015-09-07 NOTE — Telephone Encounter (Signed)
Rx faxed to [facility] @ [phone], phone number [phone]  

## 2015-09-21 ENCOUNTER — Other Ambulatory Visit: Payer: Self-pay | Admitting: *Deleted

## 2015-09-21 MED ORDER — ALPRAZOLAM 0.25 MG PO TABS: ORAL_TABLET | ORAL | Status: DC

## 2015-09-21 NOTE — Telephone Encounter (Signed)
[facility] 

## 2015-09-23 LAB — HEPATIC FUNCTION PANEL
ALT: 10 U/L (ref 7–35)
AST: 14 U/L (ref 13–35)
Alkaline Phosphatase: 63 U/L (ref 25–125)
BILIRUBIN, TOTAL: 0.3 mg/dL

## 2015-09-23 LAB — CBC AND DIFFERENTIAL
HCT: 36 % (ref 36–46)
HEMOGLOBIN: 11.3 g/dL — AB (ref 12.0–16.0)
PLATELETS: 154 10*3/uL (ref 150–399)
WBC: 4 10^3/mL

## 2015-10-05 ENCOUNTER — Other Ambulatory Visit: Payer: Self-pay | Admitting: *Deleted

## 2015-10-05 MED ORDER — HYDROCODONE-ACETAMINOPHEN 5-325 MG PO TABS: ORAL_TABLET | ORAL | Status: DC

## 2015-10-05 NOTE — Telephone Encounter (Signed)
[facility] 

## 2015-10-07 ENCOUNTER — Non-Acute Institutional Stay (SKILLED_NURSING_FACILITY): Payer: Medicare Other | Admitting: Nurse Practitioner

## 2015-10-07 ENCOUNTER — Encounter: Payer: Self-pay | Admitting: Nurse Practitioner

## 2015-10-07 DIAGNOSIS — F419 Anxiety disorder, unspecified: Secondary | ICD-10-CM

## 2015-10-07 DIAGNOSIS — M150 Primary generalized (osteo)arthritis: Secondary | ICD-10-CM | POA: Diagnosis not present

## 2015-10-07 DIAGNOSIS — N182 Chronic kidney disease, stage 2 (mild): Secondary | ICD-10-CM

## 2015-10-07 DIAGNOSIS — F32A Depression, unspecified: Secondary | ICD-10-CM

## 2015-10-07 DIAGNOSIS — F418 Other specified anxiety disorders: Secondary | ICD-10-CM

## 2015-10-07 DIAGNOSIS — M159 Polyosteoarthritis, unspecified: Secondary | ICD-10-CM

## 2015-10-07 DIAGNOSIS — I10 Essential (primary) hypertension: Secondary | ICD-10-CM

## 2015-10-07 DIAGNOSIS — F0390 Unspecified dementia without behavioral disturbance: Secondary | ICD-10-CM | POA: Diagnosis not present

## 2015-10-07 DIAGNOSIS — F329 Major depressive disorder, single episode, unspecified: Secondary | ICD-10-CM

## 2015-10-07 NOTE — Assessment & Plan Note (Addendum)
Her mood is stabilized, continue Depakote 250mg  bid, Alprazolam 0.25mg  bid.09/07/15 TSH 3.323. Depakote 250mg  bid, last valproic acid level 31.2 09/18/15

## 2015-10-07 NOTE — Progress Notes (Signed)
Patient ID: [name], female   DOB: 06-27-1934, 81 y.o.   MRN: [id number]  Location:  [facility] Room Number: 209A Place of Service:  SNF (31) Provider: Chipper Oman NP  Ron Parker, MD  Patient Care Team: Ron Parker, MD as PCP - General (Internal Medicine)  Extended Emergency Contact Information Primary Emergency Contact: Bradser,Annette Address: [address]          [place], [place] [address] [place] Home Phone: [phone] Relation: Daughter Secondary Emergency Contact: Wallick,Clarence Address: [address]          [place], [place] [address] [place] Home Phone: [phone] Mobile Phone: [phone] Relation: Spouse  Code Status:  DNR Goals of care: Advanced Directive information Advanced Directives 10/07/2015  Does patient have an advance directive? No  Type of Advance Directive -  Does patient want to make changes to advanced directive? No - Patient declined  Copy of advanced directive(s) in chart? -     Chief Complaint  Patient presents with  . Medical Management of Chronic Issues    Routine Visit    HPI:  Pt is a 81 y.o. female seen today for medical management of chronic diseases.  Hx of dementia, takes Exelon  bid for memory, her mood is stabilized whiel on Depakote  bid and Alprazolam 0.25mg  bid. Minimal edema seen while on Bumex 0.5mg  qd. Chronic pain is managed with Norco q12hr routinely.    Past Medical History  Diagnosis Date  . Hallucinations   . Abnormality of gait   . Spondylosis, cervical, with myelopathy   . Dementia   . Hypertension   . Chronic kidney disease     kidney stones  . Cervical spondylosis   . Hallucinations    Past Surgical History  Procedure Laterality Date  . Lumbar laminectomy    . Knee arthroscopy    . C-spine      No Known Allergies    Medication List       This list is accurate as of: 10/07/15  5:02 PM.  Always use your most recent med list.                ALPRAZolam 0.25 MG tablet  Commonly known as:  XANAX  Take one tablet by mouth twice daily for anxiety     bumetanide 0.5 MG tablet  Commonly known as:  BUMEX  Take 0.5 mg by mouth daily.     divalproex 125 MG DR tablet  Commonly known as:  DEPAKOTE  Take 250 mg by mouth 2 (two) times daily.     EXELON 3 MG capsule  Generic drug:  rivastigmine  Take 3 mg by mouth 2 (two) times daily.     HYDROcodone-acetaminophen 5-325 MG tablet  Commonly known as:  NORCO/VICODIN  Take one tablet by mouth every 12 hours for pain. Do not exceed 4gm of Tylenol in 24hours     phenylephrine-shark liver oil-mineral oil-petrolatum 0.25-3-14-71.9 % rectal ointment  Commonly known as:  PREPARATION H  Place 1 application rectally 2 (two) times daily as needed for hemorrhoids.     Vitamin D3 2000 units Tabs  Take 1 tablet by mouth daily.        Review of Systems  Constitutional: Negative for fever, chills, diaphoresis, activity change, appetite change, fatigue and unexpected weight change.  HENT: Negative for congestion, ear discharge, ear pain, hearing loss, postnasal drip, rhinorrhea, sore throat, tinnitus, trouble swallowing and voice change.  Eyes: Negative for pain, redness, itching and visual disturbance.  Respiratory: Negative for cough, choking, shortness of breath and wheezing.   Cardiovascular: Negative for chest pain, palpitations and leg swelling.       History of bradycardia with syncope. Admitted to the hospital in 2014.  Gastrointestinal: Negative for nausea, abdominal pain, diarrhea, constipation and abdominal distention.  Endocrine: Negative for cold intolerance, heat intolerance, polydipsia, polyphagia and polyuria.  Genitourinary: Negative for dysuria, urgency, frequency, hematuria, flank pain, vaginal discharge, difficulty urinating and pelvic pain.  Musculoskeletal: Positive for gait problem. Negative for myalgias, back pain, arthralgias, neck pain and neck  stiffness.  Skin: Negative for color change, pallor and rash.  Allergic/Immunologic: Negative.   Neurological: Negative for dizziness, tremors, seizures, syncope, weakness, numbness and headaches.       End-stage dementia  Hematological: Negative for adenopathy. Does not bruise/bleed easily.  Psychiatric/Behavioral: Negative for suicidal ideas, hallucinations, behavioral problems, confusion, sleep disturbance, dysphoric mood and agitation. The patient is not nervous/anxious and is not hyperactive.        History of hallucinations    Immunization History  Administered Date(s) Administered  . Influenza-Unspecified 03/20/2015  . PPD Test 03/24/2015   Pertinent  Health Maintenance Due  Topic Date Due  . DEXA SCAN  04/04/2002  . PNA vac Low Risk Adult (1 of 2 - PCV13) 04/04/2002  . INFLUENZA VACCINE  01/12/2016   No flowsheet data found. Functional Status Survey:    Filed Vitals:   10/07/15 1020  BP: 138/82  Pulse: 64  Temp: 97.6 F (36.4 C)  TempSrc: Oral  Resp: 18  Height: 5' (1.524 m)  Weight: 153 lb (69.4 kg)   Body mass index is 29.88 kg/(m^2). Physical Exam  Constitutional: She is oriented to person, place, and time. She appears well-developed and well-nourished. No distress.  HENT:  Head: Normocephalic and atraumatic.  Right Ear: External ear normal.  Left Ear: External ear normal.  Nose: Nose normal.  Mouth/Throat: Oropharynx is clear and moist. No oropharyngeal exudate.  Eyes: Conjunctivae and EOM are normal. Pupils are equal, round, and reactive to light. No scleral icterus.  Neck: Normal range of motion. Neck supple. No JVD present. No tracheal deviation present. No thyromegaly present.  Cardiovascular: Normal rate, regular rhythm, normal heart sounds and intact distal pulses.  Exam reveals no gallop and no friction rub.   No murmur heard. Pulmonary/Chest: Effort normal and breath sounds normal. No respiratory distress. She has no wheezes. She has no rales. She  exhibits no tenderness.  Abdominal: Soft. Bowel sounds are normal. She exhibits no distension and no mass. There is no tenderness.  Musculoskeletal: Normal range of motion. She exhibits no edema or tenderness.  Lymphadenopathy:    She has no cervical adenopathy.  Neurological: She is alert and oriented to person, place, and time. No cranial nerve deficit. Coordination normal.  End-stage dementia  Skin: Skin is warm and dry. No rash noted. She is not diaphoretic. No erythema. No pallor.  Psychiatric: Her behavior is normal.  Drowsy    Labs reviewed:  Recent Labs  01/30/15 09/07/15  NA 143 144  K 4.0 4.0  BUN 19 16  CREATININE 0.8 0.8    Recent Labs  01/30/15 09/23/15  AST 17 14  ALT 17 10  ALKPHOS 67 63    Recent Labs  01/30/15 09/23/15  WBC 3.6 4.0  HGB 11.8* 11.3*  HCT 38 36  PLT 178 154   Lab Results  Component Value Date   TSH  3.32 09/07/2015   Lab Results  Component Value Date   HGBA1C  05/13/2010    5.5 (NOTE)                                                                       According to the ADA Clinical Practice Recommendations for 2011, when HbA1c is used as a screening test:   >=6.5%   Diagnostic of Diabetes Mellitus           (if abnormal result  is confirmed)  5.7-6.4%   Increased risk of developing Diabetes Mellitus  References:Diagnosis and Classification of Diabetes Mellitus,Diabetes Care,2011,34(Suppl 1):S62-S69 and Standards of Medical Care in         Diabetes - 2011,Diabetes Care,2011,34  (Suppl 1):S11-S61.   Lab Results  Component Value Date   CHOL 162 09/24/2014   HDL 54 09/24/2014   LDLCALC 99 09/24/2014   TRIG 44 09/24/2014   CHOLHDL 3.2 05/11/2010    Significant Diagnostic Results in last 30 days:  No results found.  Assessment/Plan  Chronic kidney disease 09/07/15 Na 144, K 4.0, Bun 16, creat 0.85   Hypertension 09/07/15 Na 144, K 4.0, Bun 16, creat 0.85 Blood pressure is controlled, continue Bumex 0.5mg  daily    Anxiety  and depression Her mood is stabilized, continue Depakote  bid, Alprazolam 0.25mg  bid.09/07/15 TSH 3.323. Depakote  bid, last valproic acid level 31.2 09/18/15    Dementia Resides in SNF for care needs. Continue  Exelon  bid for memory. Depakote  bid, last valproic acid level 31.2 09/18/15   Osteoarthritis, multiple sites Pain is well managed, continue Oxycodone  q12hrs.      Family/ staff Communication: continue SNF for care needs.   Labs/tests ordered: CBC, CMP, TSH done 09/07/15

## 2015-10-07 NOTE — Assessment & Plan Note (Signed)
Pain is well managed, continue Oxycodone 5mg  q12hrs.

## 2015-10-07 NOTE — Assessment & Plan Note (Signed)
09/07/15 Na 144, K 4.0, Bun 16, creat 0.85 Blood pressure is controlled, continue Bumex 0.5mg  daily

## 2015-10-07 NOTE — Assessment & Plan Note (Signed)
09/07/15 Na 144, K 4.0, Bun 16, creat 0.85

## 2015-10-07 NOTE — Assessment & Plan Note (Addendum)
Resides in SNF for care needs. Continue  Exelon 3mg  bid for memory. Depakote 250mg  bid, last valproic acid level 31.2 09/18/15

## 2015-11-05 ENCOUNTER — Other Ambulatory Visit: Payer: Self-pay | Admitting: *Deleted

## 2015-11-05 MED ORDER — HYDROCODONE-ACETAMINOPHEN 5-325 MG PO TABS: ORAL_TABLET | ORAL | Status: DC

## 2015-11-05 NOTE — Telephone Encounter (Signed)
[facility] 

## 2015-11-11 ENCOUNTER — Non-Acute Institutional Stay (SKILLED_NURSING_FACILITY): Payer: Medicare Other | Admitting: Nurse Practitioner

## 2015-11-11 ENCOUNTER — Encounter: Payer: Self-pay | Admitting: Nurse Practitioner

## 2015-11-11 DIAGNOSIS — I10 Essential (primary) hypertension: Secondary | ICD-10-CM

## 2015-11-11 DIAGNOSIS — R443 Hallucinations, unspecified: Secondary | ICD-10-CM

## 2015-11-11 DIAGNOSIS — F418 Other specified anxiety disorders: Secondary | ICD-10-CM

## 2015-11-11 DIAGNOSIS — N181 Chronic kidney disease, stage 1: Secondary | ICD-10-CM | POA: Diagnosis not present

## 2015-11-11 DIAGNOSIS — F32A Depression, unspecified: Secondary | ICD-10-CM

## 2015-11-11 DIAGNOSIS — F0390 Unspecified dementia without behavioral disturbance: Secondary | ICD-10-CM

## 2015-11-11 DIAGNOSIS — F419 Anxiety disorder, unspecified: Secondary | ICD-10-CM

## 2015-11-11 DIAGNOSIS — F329 Major depressive disorder, single episode, unspecified: Secondary | ICD-10-CM

## 2015-11-12 NOTE — Assessment & Plan Note (Signed)
Her mood is stabilized, continue Depakote 250mg  bid, Alprazolam 0.25mg  bid.09/07/15 TSH 3.323. Depakote 250mg  bid, last valproic acid level 31.2 09/18/15

## 2015-11-12 NOTE — Progress Notes (Signed)
Patient ID: Tanya HoopsMary E Mcgillis, female   DOB: [birth year], 81 y.o.   MRN: [id number]  [facility]:  [facility] Room Number: 209 A Place of Service:  SNF (31) Provider: Chipper OmanManXie Dasani Crear NP  Ron ParkerBOWEN,SAMUEL, MD  Patient Care Team: Ron ParkerSamuel Bowen, MD as PCP - General (Internal Medicine)  Extended Emergency Contact Information Primary Emergency Contact: Bradser,Annette Address: [address]          [place], [place] [address] [place] Home Phone: [phone] Relation: Daughter Secondary Emergency Contact: Campoli,Clarence Address: [address]          [place], [place] [address] [place] Home Phone: [phone] Mobile Phone: [phone] Relation: Spouse  Code Status:  DNR Goals of care: Advanced Directive information Advanced Directives 11/11/2015  Does patient have an advance directive? Yes  Type of Advance Directive Healthcare Power of Attorney  Does patient want to make changes to advanced directive? No - Patient declined  Copy of advanced directive(s) in chart? Yes     Chief Complaint  Patient presents with  . Medical Management of Chronic Issues    HPI:  Pt is a 81 y.o. female seen today for medical management of chronic diseases.  Hx of dementia, takes Exelon 3mg  bid for memory, her mood is stabilized whiel on Depakote 250mg  bid and Alprazolam 0.25mg  bid. Minimal edema seen while on Bumex 0.5mg  qd. Chronic pain is managed with Norco q12hr routinely.    Past Medical History  Diagnosis Date  . Hallucinations   . Abnormality of gait   . Spondylosis, cervical, with myelopathy   . Dementia   . Hypertension   . Chronic kidney disease     kidney stones  . Cervical spondylosis   . Hallucinations    Past Surgical History  Procedure Laterality Date  . Lumbar laminectomy    . Knee arthroscopy    . C-spine      No Known Allergies    Medication List       This list is accurate as of: 11/11/15 11:59 PM.  Always use your most  recent med list.               ALPRAZolam 0.25 MG tablet  Commonly known as:  XANAX  Take one tablet by mouth twice daily for anxiety     bumetanide 0.5 MG tablet  Commonly known as:  BUMEX  Take 0.5 mg by mouth daily.     divalproex 125 MG DR tablet  Commonly known as:  DEPAKOTE  Take 250 mg by mouth 2 (two) times daily.     EXELON 3 MG capsule  Generic drug:  rivastigmine  Take 3 mg by mouth 2 (two) times daily.     HYDROcodone-acetaminophen 5-325 MG tablet  Commonly known as:  NORCO/VICODIN  Take one tablet by mouth every 12 hours for pain. Do not exceed 4gm of Tylenol in 24hours     phenylephrine-shark liver oil-mineral oil-petrolatum 0.25-3-14-71.9 % rectal ointment  Commonly known as:  PREPARATION H  Place 1 application rectally 2 (two) times daily as needed for hemorrhoids.     Vitamin D3 2000 units Tabs  Take 1 tablet by mouth daily.        Review of Systems  Constitutional: Negative for fever, chills, diaphoresis, activity change, appetite change, fatigue and unexpected weight change.  HENT: Negative for congestion, ear discharge, ear pain, hearing loss, postnasal drip, rhinorrhea, sore throat, tinnitus, trouble swallowing and voice change.   Eyes: Negative  for pain, redness, itching and visual disturbance.  Respiratory: Negative for cough, choking, shortness of breath and wheezing.   Cardiovascular: Negative for chest pain, palpitations and leg swelling.       History of bradycardia with syncope. Admitted to the hospital in 2014.  Gastrointestinal: Negative for nausea, abdominal pain, diarrhea, constipation and abdominal distention.  Endocrine: Negative for cold intolerance, heat intolerance, polydipsia, polyphagia and polyuria.  Genitourinary: Negative for dysuria, urgency, frequency, hematuria, flank pain, vaginal discharge, difficulty urinating and pelvic pain.  Musculoskeletal: Positive for gait problem. Negative for myalgias, back pain, arthralgias, neck  pain and neck stiffness.  Skin: Negative for color change, pallor and rash.  Allergic/Immunologic: Negative.   Neurological: Negative for dizziness, tremors, seizures, syncope, weakness, numbness and headaches.       End-stage dementia  Hematological: Negative for adenopathy. Does not bruise/bleed easily.  Psychiatric/Behavioral: Negative for suicidal ideas, hallucinations, behavioral problems, confusion, sleep disturbance, dysphoric mood and agitation. The patient is not nervous/anxious and is not hyperactive.        History of hallucinations    Immunization History  Administered Date(s) Administered  . Influenza-Unspecified 03/20/2015  . PPD Test 03/24/2015   Pertinent  Health Maintenance Due  Topic Date Due  . DEXA SCAN  04/04/2002  . PNA vac Low Risk Adult (1 of 2 - PCV13) 04/04/2002  . INFLUENZA VACCINE  01/12/2016   No flowsheet data found. Functional Status Survey:    Filed Vitals:   11/11/15 1528  BP: 100/60  Pulse: 66  Temp: 97.4 F (36.3 C)  TempSrc: Axillary  Resp: 18  Height: 5' (1.524 m)  Weight: 161 lb (73.029 kg)  SpO2: 95%   Body mass index is 31.44 kg/(m^2). Physical Exam  Constitutional: She is oriented to person, place, and time. She appears well-developed and well-nourished. No distress.  HENT:  Head: Normocephalic and atraumatic.  Right Ear: External ear normal.  Left Ear: External ear normal.  Nose: Nose normal.  Mouth/Throat: Oropharynx is clear and moist. No oropharyngeal exudate.  Eyes: Conjunctivae and EOM are normal. Pupils are equal, round, and reactive to light. No scleral icterus.  Neck: Normal range of motion. Neck supple. No JVD present. No tracheal deviation present. No thyromegaly present.  Cardiovascular: Normal rate, regular rhythm, normal heart sounds and intact distal pulses.  Exam reveals no gallop and no friction rub.   No murmur heard. Pulmonary/Chest: Effort normal and breath sounds normal. No respiratory distress. She has no  wheezes. She has no rales. She exhibits no tenderness.  Abdominal: Soft. Bowel sounds are normal. She exhibits no distension and no mass. There is no tenderness.  Musculoskeletal: Normal range of motion. She exhibits no edema or tenderness.  Lymphadenopathy:    She has no cervical adenopathy.  Neurological: She is alert and oriented to person, place, and time. No cranial nerve deficit. Coordination normal.  End-stage dementia  Skin: Skin is warm and dry. No rash noted. She is not diaphoretic. No erythema. No pallor.  Psychiatric: Her behavior is normal.  Drowsy    Labs reviewed:  Recent Labs  01/30/15 09/07/15  NA 143 144  K 4.0 4.0  BUN 19 16  CREATININE 0.8 0.8    Recent Labs  01/30/15 09/23/15  AST 17 14  ALT 17 10  ALKPHOS 67 63    Recent Labs  01/30/15 09/23/15  WBC 3.6 4.0  HGB 11.8* 11.3*  HCT 38 36  PLT 178 154   Lab Results  Component Value Date  TSH 3.32 09/07/2015   Lab Results  Component Value Date   HGBA1C  05/13/2010    5.5 (NOTE)                                                                       According to the ADA Clinical Practice Recommendations for 2011, when HbA1c is used as a screening test:   >=6.5%   Diagnostic of Diabetes Mellitus           (if abnormal result  is confirmed)  5.7-6.4%   Increased risk of developing Diabetes Mellitus  References:Diagnosis and Classification of Diabetes Mellitus,Diabetes Care,2011,34(Suppl 1):S62-S69 and Standards of Medical Care in         Diabetes - 2011,Diabetes Care,2011,34  (Suppl 1):S11-S61.   Lab Results  Component Value Date   CHOL 162 09/24/2014   HDL 54 09/24/2014   LDLCALC 99 09/24/2014   TRIG 44 09/24/2014   CHOLHDL 3.2 05/11/2010    Significant Diagnostic Results in last 30 days:  No results found.  Assessment/Plan  Hypertension 09/07/15 Na 144, K 4.0, Bun 16, creat 0.85 Blood pressure is controlled, continue Bumex 0.5mg  daily  Dementia Resides in SNF for care needs. Continue   Exelon 3mg  bid for memory. Depakote 250mg  bid, last valproic acid level 31.2 09/18/15    Chronic kidney disease 09/07/15 Na 144, K 4.0, Bun 16, creat 0.85    Hallucinations Related to dementia, stable.   Anxiety and depression Her mood is stabilized, continue Depakote 250mg  bid, Alprazolam 0.25mg  bid.09/07/15 TSH 3.323. Depakote 250mg  bid, last valproic acid level 31.2 09/18/15     Family/ staff Communication: continue SNF for care needs.   Labs/tests ordered: none

## 2015-11-12 NOTE — Assessment & Plan Note (Signed)
Related to dementia, stable.

## 2015-11-12 NOTE — Assessment & Plan Note (Signed)
Resides in SNF for care needs. Continue  Exelon 3mg  bid for memory. Depakote 250mg  bid, last valproic acid level 31.2 09/18/15

## 2015-11-12 NOTE — Assessment & Plan Note (Signed)
09/07/15 Na 144, K 4.0, Bun 16, creat 0.85

## 2015-11-12 NOTE — Assessment & Plan Note (Signed)
09/07/15 Na 144, K 4.0, Bun 16, creat 0.85 Blood pressure is controlled, continue Bumex 0.5mg  daily

## 2015-11-29 ENCOUNTER — Encounter (HOSPITAL_COMMUNITY): Payer: Self-pay | Admitting: *Deleted

## 2015-11-29 ENCOUNTER — Emergency Department (HOSPITAL_COMMUNITY)
Admission: EM | Admit: 2015-11-29 | Discharge: 2015-11-29 | Disposition: A | Discharge status: Home / Self Care | Payer: Medicare Other | Attending: Emergency Medicine | Admitting: Emergency Medicine

## 2015-11-29 DIAGNOSIS — S0181XA Laceration without foreign body of other part of head, initial encounter: Secondary | ICD-10-CM | POA: Diagnosis not present

## 2015-11-29 DIAGNOSIS — Z79899 Other long term (current) drug therapy: Secondary | ICD-10-CM | POA: Diagnosis not present

## 2015-11-29 DIAGNOSIS — Y999 Unspecified external cause status: Secondary | ICD-10-CM | POA: Diagnosis not present

## 2015-11-29 DIAGNOSIS — W19XXXA Unspecified fall, initial encounter: Secondary | ICD-10-CM

## 2015-11-29 DIAGNOSIS — Y92129 Unspecified place in nursing home as the place of occurrence of the external cause: Secondary | ICD-10-CM | POA: Diagnosis not present

## 2015-11-29 DIAGNOSIS — I129 Hypertensive chronic kidney disease with stage 1 through stage 4 chronic kidney disease, or unspecified chronic kidney disease: Secondary | ICD-10-CM | POA: Diagnosis not present

## 2015-11-29 DIAGNOSIS — W050XXA Fall from non-moving wheelchair, initial encounter: Secondary | ICD-10-CM | POA: Diagnosis not present

## 2015-11-29 DIAGNOSIS — Y939 Activity, unspecified: Secondary | ICD-10-CM | POA: Insufficient documentation

## 2015-11-29 DIAGNOSIS — N189 Chronic kidney disease, unspecified: Secondary | ICD-10-CM | POA: Diagnosis not present

## 2015-11-29 MED ORDER — LIDOCAINE HCL (PF) 1 % IJ SOLN
5.0000 mL | Freq: Once | INTRAMUSCULAR | Status: AC
  Administered 2015-11-29: 5 mL
  Filled 2015-11-29: qty 30

## 2015-11-29 MED ORDER — MIDAZOLAM HCL 2 MG/2ML IJ SOLN
2.0000 mg | Freq: Once | INTRAMUSCULAR | Status: AC
  Administered 2015-11-29: 2 mg via INTRAMUSCULAR
  Filled 2015-11-29: qty 2

## 2015-11-29 NOTE — ED Notes (Signed)
[facility] here to transport pt back [facility].

## 2015-11-29 NOTE — Discharge Instructions (Signed)
Facial Laceration ° A facial laceration is a cut on the face. These injuries can be painful and cause bleeding. Lacerations usually heal quickly, but they need special care to reduce scarring. °DIAGNOSIS  °Your health care provider will take a medical history, ask for details about how the injury occurred, and examine the wound to determine how deep the cut is. °TREATMENT  °Some facial lacerations may not require closure. Others may not be able to be closed because of an increased risk of infection. The risk of infection and the chance for successful closure will depend on various factors, including the amount of time since the injury occurred. °The wound may be cleaned to help prevent infection. If closure is appropriate, pain medicines may be given if needed. Your health care provider will use stitches (sutures), wound glue (adhesive), or skin adhesive strips to repair the laceration. These tools bring the skin edges together to allow for faster healing and a better cosmetic outcome. If needed, you may also be given a tetanus shot. °HOME CARE INSTRUCTIONS °· Only take over-the-counter or prescription medicines as directed by your health care provider. °· Follow your health care provider's instructions for wound care. These instructions will vary depending on the technique used for closing the wound. °For Sutures: °· Keep the wound clean and dry.   °· If you were given a bandage (dressing), you should change it at least once a day. Also change the dressing if it becomes wet or dirty, or as directed by your health care provider.   °· Wash the wound with soap and water 2 times a day. Rinse the wound off with water to remove all soap. Pat the wound dry with a clean towel.   °· After cleaning, apply a thin layer of the antibiotic ointment recommended by your health care provider. This will help prevent infection and keep the dressing from sticking.   °· You may shower as usual after the first 24 hours. Do not soak the  wound in water until the sutures are removed.   °· Get your sutures removed as directed by your health care provider. With facial lacerations, sutures should usually be taken out after 4-5 days to avoid stitch marks.   °· Wait a few days after your sutures are removed before applying any makeup. °For Skin Adhesive Strips: °· Keep the wound clean and dry.   °· Do not get the skin adhesive strips wet. You may bathe carefully, using caution to keep the wound dry.   °· If the wound gets wet, pat it dry with a clean towel.   °· Skin adhesive strips will fall off on their own. You may trim the strips as the wound heals. Do not remove skin adhesive strips that are still stuck to the wound. They will fall off in time.   °For Wound Adhesive: °· You may briefly wet your wound in the shower or bath. Do not soak or scrub the wound. Do not swim. Avoid periods of heavy sweating until the skin adhesive has fallen off on its own. After showering or bathing, gently pat the wound dry with a clean towel.   °· Do not apply liquid medicine, cream medicine, ointment medicine, or makeup to your wound while the skin adhesive is in place. This may loosen the film before your wound is healed.   °· If a dressing is placed over the wound, be careful not to apply tape directly over the skin adhesive. This may cause the adhesive to be pulled off before the wound is healed.   °· Avoid   prolonged exposure to sunlight or tanning lamps while the skin adhesive is in place. °· The skin adhesive will usually remain in place for 5-10 days, then naturally fall off the skin. Do not pick at the adhesive film.   °After Healing: °Once the wound has healed, cover the wound with sunscreen during the day for 1 full year. This can help minimize scarring. Exposure to ultraviolet light in the first year will darken the scar. It can take 1-2 years for the scar to lose its redness and to heal completely.  °SEEK MEDICAL CARE IF: °· You have a fever. °SEEK IMMEDIATE  MEDICAL CARE IF: °· You have redness, pain, or swelling around the wound.   °· You see a yellowish-white fluid (pus) coming from the wound.   °  °This information is not intended to replace advice given to you by your health care provider. Make sure you discuss any questions you have with your health care provider. °  °Document Released: 07/07/2004 Document Revised: 06/20/2014 Document Reviewed: 01/10/2013 °Elsevier Interactive Patient Education ©2016 [employer] ° °

## 2015-11-29 NOTE — ED Notes (Signed)
Bed: [id number] Expected date:  Expected time:  Means of arrival:  Comments: Elderly fall, laceration

## 2015-11-29 NOTE — ED Notes (Signed)
[facility] called for transport of patient 

## 2015-11-29 NOTE — ED Provider Notes (Signed)
CSN: [id number]     Arrival date & time 11/29/15  1627 History   First MD Initiated Contact with Patient 11/29/15 1643     Chief Complaint  Patient presents with  . Fall    unwitnessned fall with head laceration left side       Patient is a 81 y.o. female presenting with fall. The history is provided by the patient and a relative.  Fall  Level V caveat due to dementia. Patient with reported fall at nursing home. Reportedly got out of wheelchair and fell. No loss consciousness. Patient is at her baseline dementia this time. Approximately 1.5 cm laceration to left superior orbital rim area. No other apparent injury. Discussed with patient's daughter and they do not want a head CT at this time.  Past Medical History  Diagnosis Date  . Hallucinations   . Abnormality of gait   . Spondylosis, cervical, with myelopathy   . Dementia   . Hypertension   . Chronic kidney disease     kidney stones  . Cervical spondylosis   . Hallucinations    Past Surgical History  Procedure Laterality Date  . Lumbar laminectomy    . Knee arthroscopy    . C-spine     Family History  Problem Relation Age of Onset  . Hypertension Mother   . Hypertension Father    Social History  Substance Use Topics  . Smoking status: Never Smoker   . Smokeless tobacco: Never Used  . Alcohol Use: No   OB History    No data available     Review of Systems  Unable to perform ROS: Dementia      Allergies  Review of patient's allergies indicates no known allergies.  Home Medications   Prior to Admission medications   Medication Sig Start Date End Date Taking? Authorizing Provider  ALPRAZolam Prudy Feeler(XANAX) 0.25 MG tablet Take one tablet by mouth twice daily for anxiety Patient taking differently: Take 0.25 mg by mouth every morning.  09/21/15  Yes Tiffany L Reed, DO  bumetanide (BUMEX) 0.5 MG tablet Take 0.5 mg by mouth daily.   Yes Historical Provider, MD  Cholecalciferol (VITAMIN D3) 2000 units TABS Take 1  tablet by mouth daily.   Yes Historical Provider, MD  divalproex (DEPAKOTE SPRINKLE) 125 MG capsule Take 250 mg by mouth 2 (two) times daily.   Yes Historical Provider, MD  HYDROcodone-acetaminophen (NORCO/VICODIN) 5-325 MG tablet Take one tablet by mouth every 12 hours for pain. Do not exceed 4gm of Tylenol in 24hours 11/05/15  Yes Tiffany L Reed, DO  rivastigmine (EXELON) 3 MG capsule Take 3 mg by mouth 2 (two) times daily.   Yes Historical Provider, MD   BP 125/52 mmHg  Pulse 54  Temp(Src) 98.1 F (36.7 C) (Oral)  Resp 14  SpO2 100% Physical Exam  Constitutional: She appears well-developed.  HENT:  1.5 cm laceration along left orbital ridge.  Neck: Neck supple.  Pulmonary/Chest: Effort normal.  Abdominal: Soft.  Musculoskeletal: She exhibits no tenderness.  Neurological: She is alert.  At baseline dementia per family members.  Skin: Skin is warm.    ED Course  Procedures (including critical care time) Labs Review Labs Reviewed - No data to display  Imaging Review No results found. I have personally reviewed and evaluated these images and lab results as part of my medical decision-making.   EKG Interpretation None      MDM   Final diagnoses:  Fall, initial encounter  Facial laceration, initial  encounter    Patient with fall. Facial laceration. Wound closed. 2 mg of IM Versed used for anxiolysis. Sutures to be removed around 7 days.  LACERATION REPAIR Performed by: Billee Cashing Authorized by: Billee Cashing Consent: Verbal consent obtained. Risks and benefits: risks, benefits and alternatives were discussed Consent given by: patient Patient identity confirmed: provided demographic data Prepped and Draped in normal sterile fashion Wound explored  Laceration Location: left face  Laceration Length: 1.5cm  No Foreign Bodies seen or palpated  Anesthesia: local infiltration  Local anesthetic: lidocaine 1% with epinephrine  Anesthetic total: 2  ml  Irrigation method: syringe Amount of cleaning: standard  Skin closure: 4-0 vicryl rapide  Number of sutures: 3  Technique: simple interrupted  Patient tolerance: Patient tolerated the procedure well with no immediate complications.    Benjiman Core, MD 11/29/15 2308

## 2015-11-29 NOTE — ED Notes (Signed)
[facility] had an unwitnessed fall with left side head laceration. Nursing facility applied 3 steri strips. Patient has dementia and denies pain at present. Patient  head bleeding at present applied 4 x4 gauze and kerlix bleeding controlled.

## 2015-11-29 NOTE — ED Notes (Signed)
Report called to [facility], report given to PiketonKristy.

## 2015-12-03 ENCOUNTER — Other Ambulatory Visit: Payer: Self-pay | Admitting: *Deleted

## 2015-12-03 MED ORDER — HYDROCODONE-ACETAMINOPHEN 5-325 MG PO TABS: ORAL_TABLET | ORAL | Status: AC

## 2015-12-03 NOTE — Telephone Encounter (Signed)
[facility] 

## 2015-12-03 NOTE — Telephone Encounter (Signed)
DISREGARD Rx - Shredded due to we no longer service [facility].

## 2015-12-14 ENCOUNTER — Other Ambulatory Visit: Payer: Self-pay

## 2015-12-14 MED ORDER — ALPRAZOLAM 0.25 MG PO TABS: ORAL_TABLET | ORAL | Status: AC

## 2015-12-14 NOTE — Telephone Encounter (Signed)
Refill request [facility]  

## 2016-05-16 IMAGING — CT CT HEAD W/O CM
2 of 5 series · 11 of 47 positions shown, 13 images · non-contrast
Comparison: CT of the head July 25, 2014

CLINICAL DATA: Unwitnessed fall at [HOSPITAL], found on ground.
Dementia.

EXAM:
CT HEAD WITHOUT CONTRAST
CT CERVICAL SPINE WITHOUT CONTRAST
TECHNIQUE: Multidetector CT imaging of the head and cervical spine was
performed following the standard protocol without intravenous
contrast. Multiplanar CT image reconstructions of the cervical spine
were also generated.

[Series 305: coronal upper · coronal · 0.35mm/px · 8 of 62 slices shown, 10 images]
[im 7/62  brain]
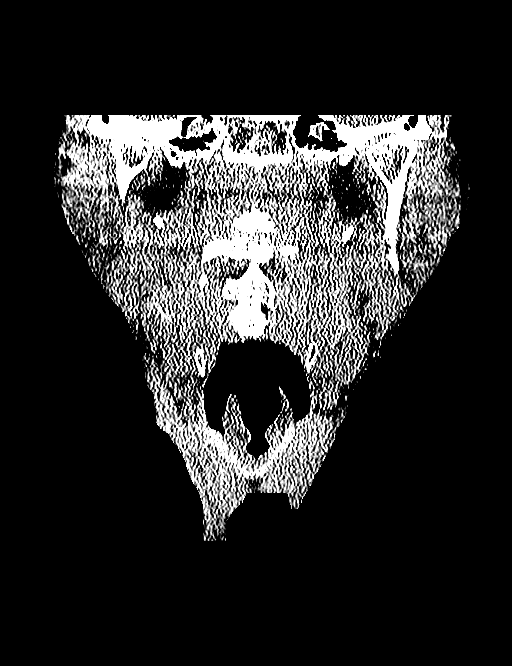
[im 7/62  bone]
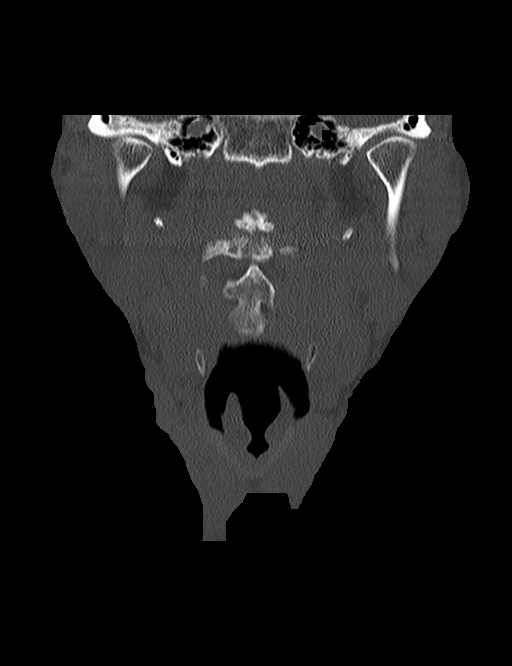
[im 14/62  brain]
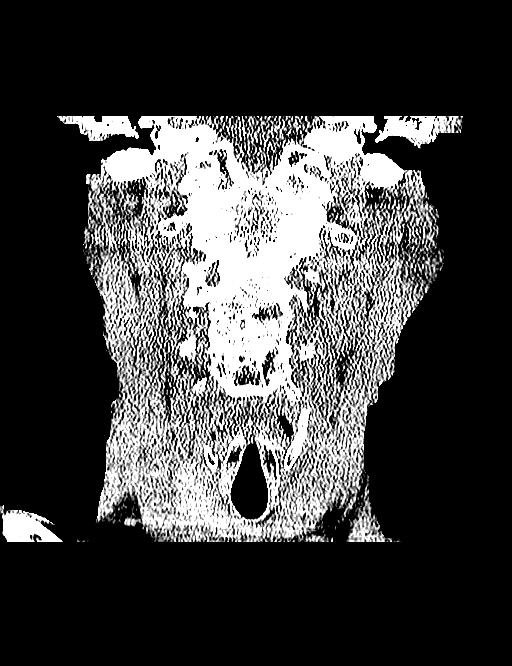
[im 21/62  brain]
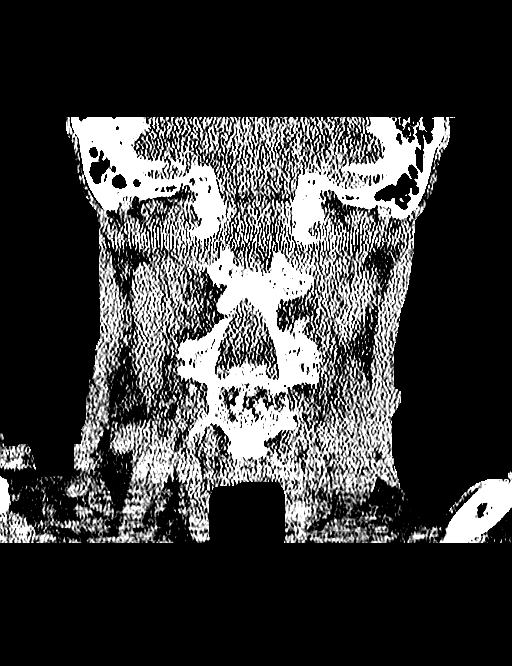
[im 28/62  brain]
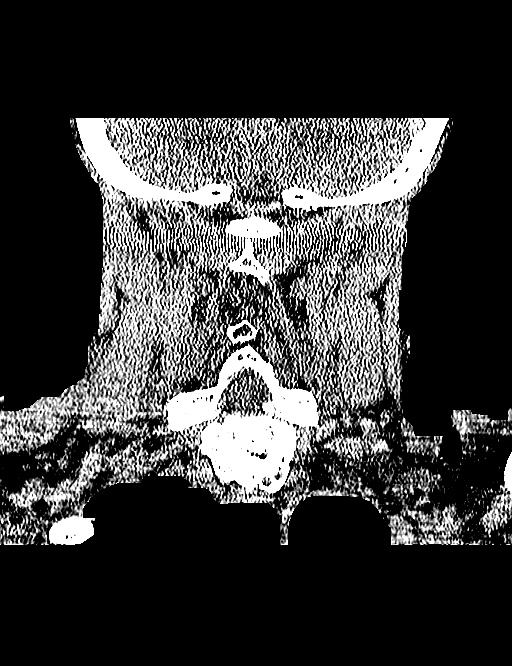
[im 34/62  brain]
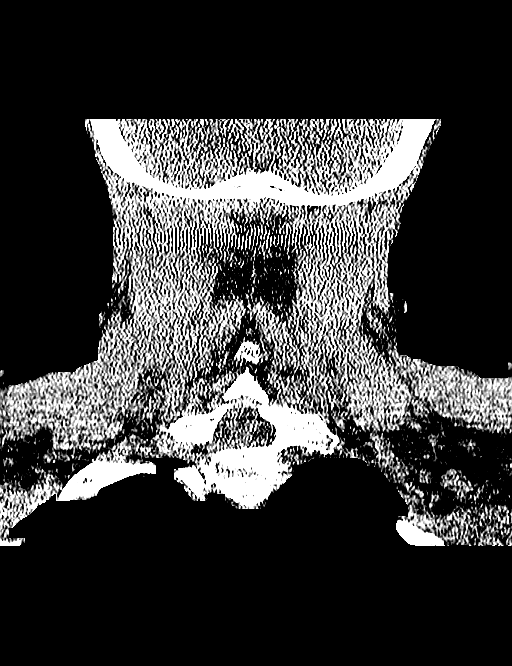
[im 34/62  bone]
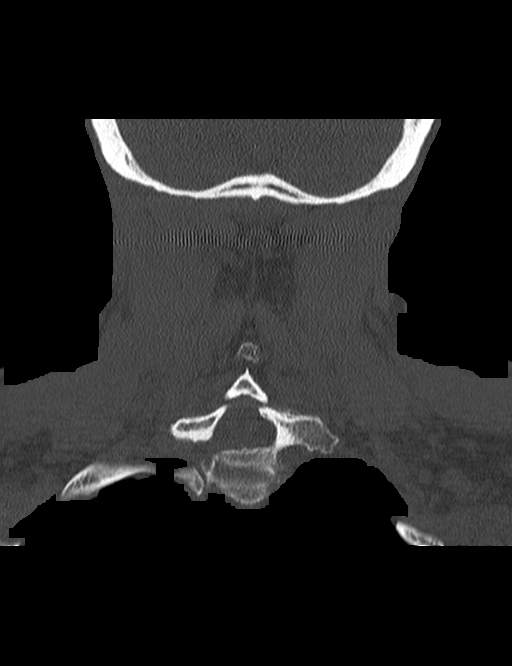
[im 41/62  brain]
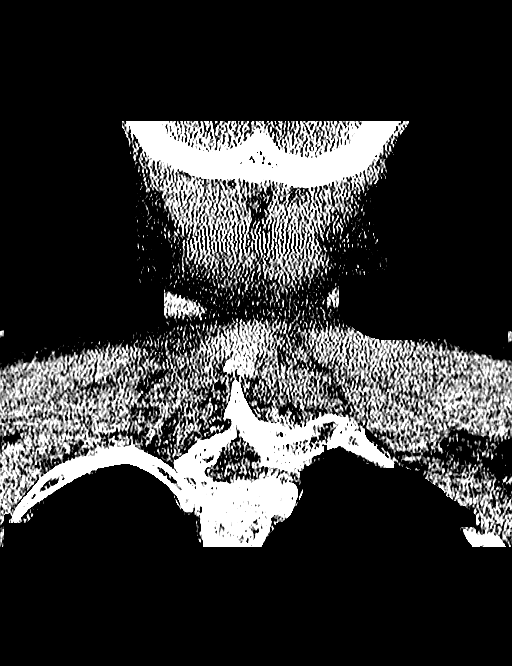
[im 48/62  brain]
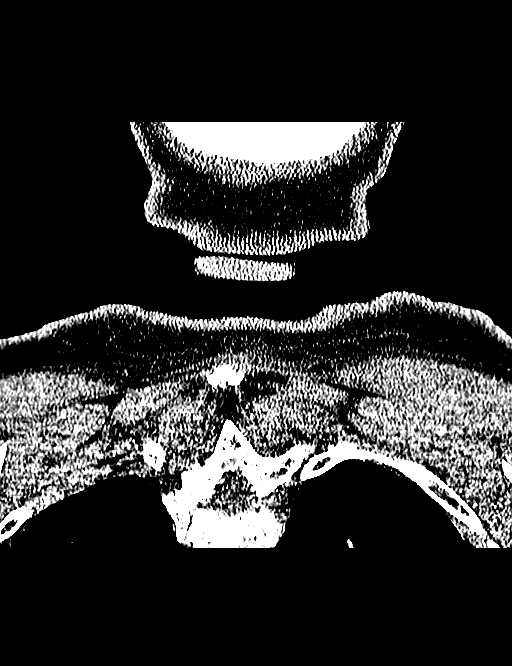
[im 55/62  brain]
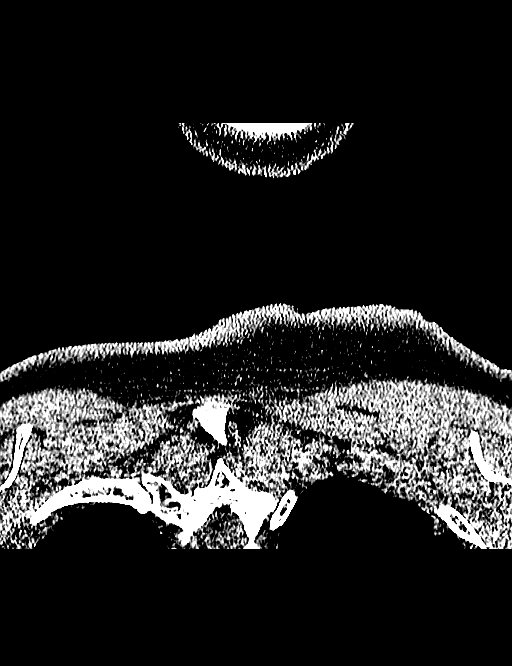

[Series 307: sag · sagittal · 0.35mm/px · 3 of 50 slices shown]
[im 17/50  brain]
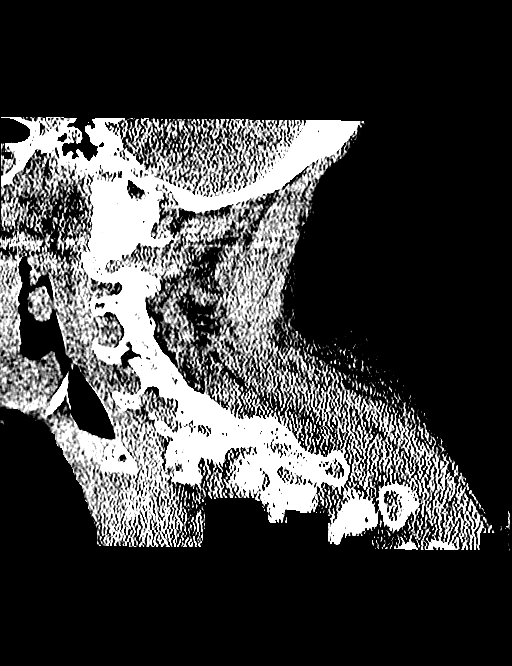
[im 25/50  brain]
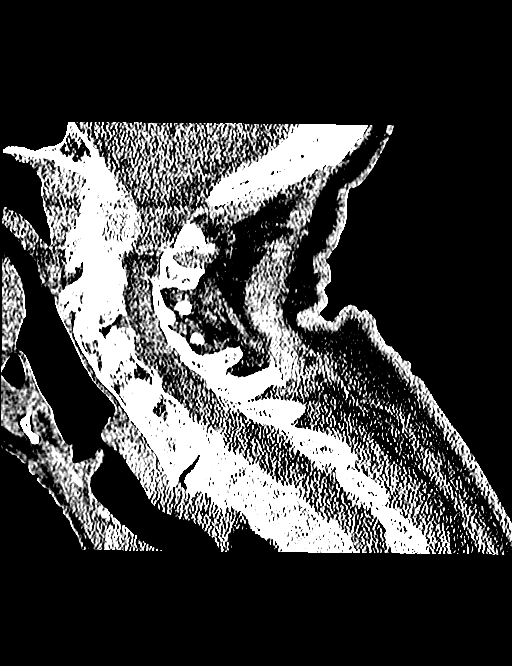
[im 33/50  brain]
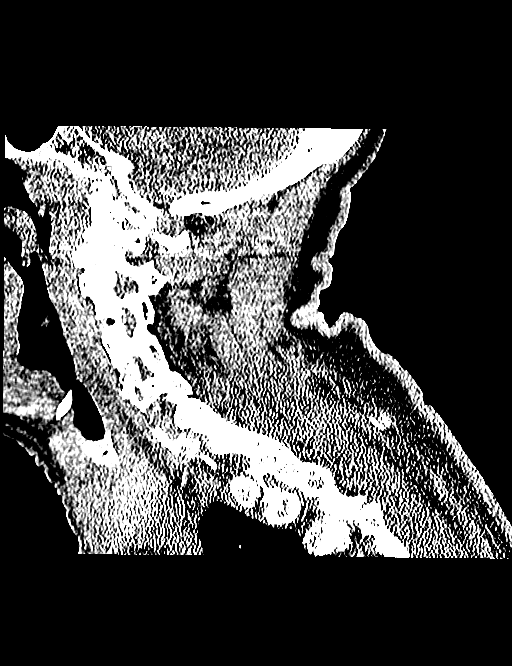

[11 of 47 positions shown; findings below may reference images not displayed]

FINDINGS: CT HEAD FINDINGS

Mild motion degraded examination. The ventricles and sulci are
normal for age. No intraparenchymal hemorrhage, mass effect nor
midline shift. Patchy supratentorial white matter hypodensities are
within normal range for patient's age and though non-specific
suggest sequelae of chronic small vessel ischemic disease. No acute
large vascular territory infarcts.

No abnormal extra-axial fluid collections. Basal cisterns are
patent. Moderate calcific atherosclerosis of the carotid siphons.

No skull fracture. The included ocular globes and orbital contents
are non-suspicious; left drusen. RIGHT maxillary mucosal retention
cysts, no paranasal sinus air-fluid levels. The mastoid air cells
are well aerated.

CT CERVICAL SPINE FINDINGS

Solid cervical interbody fusion limits assessment for precise levels
though, there appears to be a fusion from C2 through C6. Severe C6-7
disc height loss, endplate sclerosis, vacuum disc consistent with
degenerative disc. Severe C1-2 osteoarthrosis with extensive
calcified pannus resulting in at least mild canal stenosis, this
likely represents CPPD. Bone mineral density is decreased. Nuchal
ligament calcifications.

Solid T1 and T2 interbody arthrodesis.
IMPRESSION: CT HEAD: No acute intracranial process.

Involutional changes. Mild to moderate white matter changes suggest
chronic small vessel ischemic disease.

CT CERVICAL SPINE: No acute fracture or malalignment.

Solid cervical fusion.

  By: Ketiona Joenniemi

## 2017-05-13 DEATH — deceased
